# Patient Record
Sex: Female | Born: 1970 | Race: Black or African American | Hispanic: No | Marital: Married | Smoking: Never smoker
Health system: Southern US, Community
[De-identification: ages and names within clinical notes are randomized; demographics above are authoritative.]

## PROBLEM LIST (undated history)

## (undated) DIAGNOSIS — I10 Essential (primary) hypertension: Secondary | ICD-10-CM

## (undated) DIAGNOSIS — I471 Supraventricular tachycardia, unspecified: Secondary | ICD-10-CM

## (undated) DIAGNOSIS — J309 Allergic rhinitis, unspecified: Secondary | ICD-10-CM

## (undated) DIAGNOSIS — Z8632 Personal history of gestational diabetes: Secondary | ICD-10-CM

## (undated) DIAGNOSIS — R61 Generalized hyperhidrosis: Secondary | ICD-10-CM

## (undated) DIAGNOSIS — G43909 Migraine, unspecified, not intractable, without status migrainosus: Secondary | ICD-10-CM

## (undated) DIAGNOSIS — E559 Vitamin D deficiency, unspecified: Secondary | ICD-10-CM

## (undated) HISTORY — DX: Migraine, unspecified, not intractable, without status migrainosus: G43.909

## (undated) HISTORY — PX: WISDOM TOOTH EXTRACTION: SHX21

## (undated) HISTORY — DX: Essential (primary) hypertension: I10

## (undated) HISTORY — DX: Generalized hyperhidrosis: R61

## (undated) HISTORY — DX: Personal history of gestational diabetes: Z86.32

## (undated) HISTORY — DX: Allergic rhinitis, unspecified: J30.9

## (undated) HISTORY — PX: FOOT SURGERY: SHX648

## (undated) HISTORY — DX: Vitamin D deficiency, unspecified: E55.9

---

## 1999-06-04 ENCOUNTER — Inpatient Hospital Stay (HOSPITAL_COMMUNITY): Admission: AD | Admit: 1999-06-04 | Discharge: 1999-06-04 | Payer: Self-pay | Admitting: Obstetrics & Gynecology

## 1999-06-23 ENCOUNTER — Inpatient Hospital Stay (HOSPITAL_COMMUNITY): Admission: AD | Admit: 1999-06-23 | Discharge: 1999-06-27 | Payer: Self-pay | Admitting: Obstetrics and Gynecology

## 1999-07-01 ENCOUNTER — Encounter (HOSPITAL_COMMUNITY): Admission: RE | Admit: 1999-07-01 | Discharge: 1999-09-29 | Payer: Self-pay | Admitting: *Deleted

## 1999-08-06 ENCOUNTER — Other Ambulatory Visit: Admission: RE | Admit: 1999-08-06 | Discharge: 1999-08-06 | Payer: Self-pay | Admitting: *Deleted

## 2000-07-29 ENCOUNTER — Other Ambulatory Visit: Admission: RE | Admit: 2000-07-29 | Discharge: 2000-07-29 | Payer: Self-pay | Admitting: *Deleted

## 2000-08-31 ENCOUNTER — Encounter: Payer: Self-pay | Admitting: Emergency Medicine

## 2000-08-31 ENCOUNTER — Emergency Department (HOSPITAL_COMMUNITY): Admission: EM | Admit: 2000-08-31 | Discharge: 2000-08-31 | Payer: Self-pay | Admitting: Emergency Medicine

## 2001-08-24 ENCOUNTER — Other Ambulatory Visit: Admission: RE | Admit: 2001-08-24 | Discharge: 2001-08-24 | Payer: Self-pay | Admitting: *Deleted

## 2001-10-30 ENCOUNTER — Ambulatory Visit (HOSPITAL_COMMUNITY): Admission: RE | Admit: 2001-10-30 | Discharge: 2001-10-30 | Payer: Self-pay | Admitting: Family Medicine

## 2001-10-30 ENCOUNTER — Encounter: Payer: Self-pay | Admitting: Family Medicine

## 2002-09-12 ENCOUNTER — Other Ambulatory Visit: Admission: RE | Admit: 2002-09-12 | Discharge: 2002-09-12 | Payer: Self-pay | Admitting: *Deleted

## 2003-01-29 ENCOUNTER — Emergency Department (HOSPITAL_COMMUNITY): Admission: EM | Admit: 2003-01-29 | Discharge: 2003-01-29 | Payer: Self-pay | Admitting: Emergency Medicine

## 2003-01-29 ENCOUNTER — Encounter: Payer: Self-pay | Admitting: Emergency Medicine

## 2003-09-15 ENCOUNTER — Other Ambulatory Visit: Admission: RE | Admit: 2003-09-15 | Discharge: 2003-09-15 | Payer: Self-pay | Admitting: *Deleted

## 2004-10-23 ENCOUNTER — Other Ambulatory Visit: Admission: RE | Admit: 2004-10-23 | Discharge: 2004-10-23 | Payer: Self-pay | Admitting: Obstetrics & Gynecology

## 2005-12-22 ENCOUNTER — Other Ambulatory Visit: Admission: RE | Admit: 2005-12-22 | Discharge: 2005-12-22 | Payer: Self-pay | Admitting: Obstetrics and Gynecology

## 2006-07-09 ENCOUNTER — Other Ambulatory Visit: Admission: RE | Admit: 2006-07-09 | Discharge: 2006-07-09 | Payer: Self-pay | Admitting: Obstetrics & Gynecology

## 2007-04-12 ENCOUNTER — Other Ambulatory Visit: Admission: RE | Admit: 2007-04-12 | Discharge: 2007-04-12 | Payer: Self-pay | Admitting: Obstetrics and Gynecology

## 2007-10-01 ENCOUNTER — Inpatient Hospital Stay (HOSPITAL_COMMUNITY): Admission: AD | Admit: 2007-10-01 | Discharge: 2007-10-01 | Payer: Self-pay | Admitting: Obstetrics and Gynecology

## 2007-11-09 ENCOUNTER — Encounter: Admission: RE | Admit: 2007-11-09 | Discharge: 2007-12-02 | Payer: Self-pay | Admitting: Obstetrics and Gynecology

## 2007-12-13 ENCOUNTER — Inpatient Hospital Stay (HOSPITAL_COMMUNITY): Admission: AD | Admit: 2007-12-13 | Discharge: 2007-12-15 | Payer: Self-pay | Admitting: Obstetrics and Gynecology

## 2007-12-16 ENCOUNTER — Encounter: Admission: RE | Admit: 2007-12-16 | Discharge: 2008-01-08 | Payer: Self-pay | Admitting: Obstetrics and Gynecology

## 2007-12-19 ENCOUNTER — Inpatient Hospital Stay (HOSPITAL_COMMUNITY): Admission: AD | Admit: 2007-12-19 | Discharge: 2007-12-19 | Payer: Self-pay | Admitting: Obstetrics and Gynecology

## 2008-01-13 ENCOUNTER — Encounter: Admission: RE | Admit: 2008-01-13 | Discharge: 2008-01-20 | Payer: Self-pay | Admitting: Obstetrics and Gynecology

## 2009-01-16 ENCOUNTER — Emergency Department (HOSPITAL_BASED_OUTPATIENT_CLINIC_OR_DEPARTMENT_OTHER): Admission: EM | Admit: 2009-01-16 | Discharge: 2009-01-17 | Payer: Self-pay | Admitting: Emergency Medicine

## 2009-01-16 ENCOUNTER — Ambulatory Visit: Payer: Self-pay | Admitting: Diagnostic Radiology

## 2010-11-09 ENCOUNTER — Emergency Department (HOSPITAL_BASED_OUTPATIENT_CLINIC_OR_DEPARTMENT_OTHER)
Admission: EM | Admit: 2010-11-09 | Discharge: 2010-11-09 | Payer: Self-pay | Source: Home / Self Care | Admitting: Emergency Medicine

## 2010-11-18 ENCOUNTER — Encounter
Admission: RE | Admit: 2010-11-18 | Discharge: 2010-11-18 | Payer: Self-pay | Source: Home / Self Care | Attending: Family Medicine | Admitting: Family Medicine

## 2011-03-25 NOTE — H&P (Signed)
NAMEMACKAYLA, Arnold                 ACCOUNT NO.:  [id number]   MEDICAL RECORD NO.:  [id number]          PATIENT TYPE:  INP   LOCATION:  9164                          FACILITY:  [facility]   PHYSICIAN:  Hal Morales, M.D.DATE OF BIRTH:  10-Mar-1971   DATE OF ADMISSION:  12/13/2007  DATE OF DISCHARGE:                              HISTORY & PHYSICAL   HISTORY OF PRESENT ILLNESS:  [name] is a 40 year old black female  gravida 3, para 5-0-1-1 at 35-6/7 weeks who presents with leaking fluids  since 3 o'clock a.m. with spontaneous contractions.  Since that time,  she denies bleeding, headache or visual disturbances.  Her pregnancy has  been followed by the [facility] MD service and has been  remarkable for:  1. Previous C-section for failure to progress in 2000.  2. Placenta previa that resolved at 29 weeks.  3. History of macrosomic infant.  4. Migraines.  5. Anemia.  6. Advanced maternal age.  7. Gestational diabetes with recent start on insulin.  8. Group B strep unknown.   PRENATAL LABORATORIES:  Her prenatal labs were collected on June 08, 2007 hemoglobin 12.7, hematocrit 38.4, platelets 229,000.  Blood type O+  and antibody negative, sickle cell trait negative.  RPR nonreactive,  rubella immune, hepatitis B surface antigen negative, HIV nonreactive,  gonorrhea negative, chlamydia negative.  Ferritin 30.  A one-hour  Glucola from October 20, 2007, was elevated.  A 3-hour glucose  tolerance test in December 2008 was abnormal.   HISTORY OF PRESENT PREGNANCY:  The patient presented for care at [facility] on June 08, 2007, at [redacted] weeks gestation.  Ultrasound was done at  that first visit for viability due to spotting.  EDC was confirmed by  that ultrasound as being January 11, 2008.  Placenta previa was noted.  Due  to her ferritin level of 30, she was instructed to try iron two to three  per day.  Anatomy ultrasound at [redacted] weeks gestation shows growth  consistent with  previous dating.  Placenta is still present.  At this  point, the patient was desiring a VBAC for delivery mode.  Ultrasound at  [redacted] weeks gestation.  She has continued previa.  The patient was treated  for yeast infection at that visit.  At 26-1/2 weeks, she was due for her  Glucola test which was postponed due to glycosuria due to sweet tea.  She was reporting heart palpitations at that visit at 26 weeks.  She was  referred to Cardiology for evaluation.  Ultrasound at 29 weeks shows  previa resolved.  Her one-hour Glucola was elevated, and she completed  her 3-hour glucose tolerance test in December, and that was abnormal.  At 31 weeks, she was [facility]  instructions.  The patient's gestational diabetes was managed by diet  until approximately 2 weeks ago when she was started on some insulin.  The patient also started antenatal testing in the last couple weeks,  which has been normal.  Her next ultrasound was scheduled for February  5.  She also had a repeat C section planned for February 26.  Rest of  her prenatal care has been unremarkable.   OBSTETRIC HISTORY:  She is gravida 3, para 1-0-1-1.  In March 1994 at [redacted]  weeks gestation, she had an elective AB with no complications.  In  August 2000, she had a C-section for a viable female weighing 9 pounds 6  ounces at [redacted] weeks gestation after 24 hours in labor.  She had an  epidural for anesthesia.  Incision type was verified as low transverse  as well as a two layer closure.  Infant's name was Arlys John, and there were  no complications with that pregnancy.  This third pregnancy is the  current pregnancy.   PAST MEDICAL HISTORY:  1. No medication allergies.  2. Menarche at the age of 26 with monthly cycles lasting 5-7 days.  3. Used oral contraception and condoms in the past as well as film.  4. Fibroids noted in her breasts and occasional ovarian cyst.  5. Reports never having chicken pox.  6.  Occasional yeast infection.  7. History of anemia.  8. Migraines for which she takes Tylenol.   SURGICAL HISTORY:  1. Wisdom teeth extraction.  2. Foot surgery.  3. Cesarean section in 2000.   FAMILY MEDICAL HISTORY:  Maternal grandmother with CHF.  Mother with  heart murmur.  Maternal grandfather with chronic hypertension.  Multiple  family members with anemia.  Maternal grandfather with CVA.   GENETIC HISTORY:  Remarkable for patient at age 69 at time of delivery.  Mother with congenital heart disease   SOCIAL HISTORY:  The patient has undergone a name change during the  pregnancy.  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  She denies any alcohol, tobacco or illicit drug use with the  pregnancy.   OBJECTIVE:  VITAL SIGNS:  Stable.  CBG is 105.  She is afebrile.  HEENT:  Grossly within normal limits.  CHEST:  Clear to auscultation.  HEART:  Regular rate and rhythm.  ABDOMEN:  Gravid in contour with fundal height extending approximately  36 cm above pubic symphysis.  Fetal heart rate is reassuring.  Contractions every 2-3 minutes, cervix is 2 cm, 80% effaced, vertex -2  with clear fluid present.  EXTREMITIES:  Normal.   ASSESSMENT:  1. Intrauterine pregnancy at 35-6/7 weeks.  2. Previous C-section with a attempt for VBAC.  3. Unknown group B strep.   PLAN:  1. Admit to birthing suites, Dr. Pennie Rushing has been notified.  2. Routine MD orders.  3. Begin penicillin G for unknown group B strep.  4. The patient aware of options for repeat C-section versus VBAC.      Risks and benefits have been reviewed.  The patient verbalizes      intent for trial of labor at present and signed the VBAC consent      form.      Cam Hai, C.N.M.      Hal Morales, M.D.  Electronically Signed    KS/MEDQ  D:  12/13/2007  T:  12/13/2007  Job:  [id number]

## 2011-08-01 LAB — CBC
HCT: 26.8 — ABNORMAL LOW
HCT: 32.4 — ABNORMAL LOW
Hemoglobin: 10.3 — ABNORMAL LOW
Hemoglobin: 10.9 — ABNORMAL LOW
Hemoglobin: 9.1 — ABNORMAL LOW
MCHC: 33.7
MCHC: 33.8
MCHC: 34.2
MCV: 90.8
MCV: 91.5
Platelets: 282
RBC: 3.57 — ABNORMAL LOW
RDW: 14.1
RDW: 14.8
WBC: 8.8

## 2011-08-01 LAB — URINE MICROSCOPIC-ADD ON

## 2011-08-01 LAB — URINALYSIS, ROUTINE W REFLEX MICROSCOPIC
Nitrite: NEGATIVE
Specific Gravity, Urine: <1.005 — ABNORMAL LOW
Urobilinogen, UA: 0.2

## 2011-08-01 LAB — COMPREHENSIVE METABOLIC PANEL
ALT: 77 — ABNORMAL HIGH
Albumin: 2.5 — ABNORMAL LOW
Calcium: 8.2 — ABNORMAL LOW
Glucose, Bld: 80
Sodium: 141
Total Protein: 5.4 — ABNORMAL LOW

## 2011-08-01 LAB — STREP B DNA PROBE: Strep Group B Ag: NEGATIVE

## 2011-08-01 LAB — LACTATE DEHYDROGENASE: LDH: 239

## 2011-08-01 LAB — URIC ACID: Uric Acid, Serum: 4.9

## 2011-08-19 LAB — URINALYSIS, ROUTINE W REFLEX MICROSCOPIC
Bilirubin Urine: NEGATIVE
Nitrite: NEGATIVE
Specific Gravity, Urine: 1.02
Urobilinogen, UA: 0.2
pH: 5.5

## 2011-08-19 LAB — CBC
Hemoglobin: 10.5 — ABNORMAL LOW
MCHC: 33.2
RBC: 3.39 — ABNORMAL LOW
WBC: 10.3

## 2011-12-28 ENCOUNTER — Encounter (HOSPITAL_BASED_OUTPATIENT_CLINIC_OR_DEPARTMENT_OTHER): Payer: Self-pay | Admitting: *Deleted

## 2011-12-28 ENCOUNTER — Emergency Department (HOSPITAL_BASED_OUTPATIENT_CLINIC_OR_DEPARTMENT_OTHER)
Admission: EM | Admit: 2011-12-28 | Discharge: 2011-12-28 | Disposition: A | Discharge status: Home / Self Care | Payer: Self-pay | Attending: Emergency Medicine | Admitting: Emergency Medicine

## 2011-12-28 DIAGNOSIS — G43909 Migraine, unspecified, not intractable, without status migrainosus: Secondary | ICD-10-CM | POA: Insufficient documentation

## 2011-12-28 DIAGNOSIS — R112 Nausea with vomiting, unspecified: Secondary | ICD-10-CM | POA: Insufficient documentation

## 2011-12-28 MED ORDER — SODIUM CHLORIDE 0.9 % IV BOLUS (SEPSIS)
1000.0000 mL | Freq: Once | INTRAVENOUS | Status: AC
  Administered 2011-12-28: 1000 mL via INTRAVENOUS

## 2011-12-28 MED ORDER — METOCLOPRAMIDE HCL 5 MG/ML IJ SOLN
10.0000 mg | Freq: Once | INTRAMUSCULAR | Status: AC
  Administered 2011-12-28: 10 mg via INTRAVENOUS
  Filled 2011-12-28: qty 2

## 2011-12-28 MED ORDER — DIPHENHYDRAMINE HCL 50 MG/ML IJ SOLN
25.0000 mg | Freq: Once | INTRAMUSCULAR | Status: AC
  Administered 2011-12-28: 25 mg via INTRAVENOUS
  Filled 2011-12-28: qty 1

## 2011-12-28 MED ORDER — DEXAMETHASONE SODIUM PHOSPHATE 10 MG/ML IJ SOLN
10.0000 mg | Freq: Once | INTRAMUSCULAR | Status: AC
  Administered 2011-12-28: 10 mg via INTRAVENOUS
  Filled 2011-12-28: qty 1

## 2011-12-28 NOTE — ED Notes (Signed)
Pt presents to ED with HA/migraine.  Pt has recent dx of sinus infection .  Pt has hx of same.  Pt took no otc ibuprofen PTA.

## 2011-12-28 NOTE — ED Notes (Signed)
Pt presnts to ED today with HA since Sat.  Pt has associated nausea.  Pt last took "migraine" medicine last night with no relief.  Pt also took Mucinex DM last night.

## 2011-12-28 NOTE — ED Provider Notes (Addendum)
History     CSN: [id number]  Arrival date & time 12/28/11  0441   First MD Initiated Contact with Patient 12/28/11 0501      Chief Complaint  Patient presents with  . Headache    (Consider location/radiation/quality/duration/timing/severity/associated sxs/prior treatment) Patient is a 41 y.o. female presenting with headaches. The history is provided by the patient.  Headache  This is a recurrent problem. The current episode started yesterday. The problem occurs constantly. The problem has been gradually worsening. The headache is associated with bright light and loud noise. The pain is located in the occipital region. The quality of the pain is described as throbbing. The pain is at a severity of 8/10. The pain is severe. The pain does not radiate. Associated symptoms include nausea and vomiting. Pertinent negatives include no fever, no near-syncope and no palpitations. She has tried resting in a darkened room and triptan therapy for the symptoms. The treatment provided no relief.    History reviewed. No pertinent past medical history.  History reviewed. No pertinent past surgical history.  History reviewed. No pertinent family history.  History  Substance Use Topics  . Smoking status: Not on file  . Smokeless tobacco: Not on file  . Alcohol Use: Not on file    OB History    Grav Para Term Preterm Abortions TAB SAB Ect Mult Living                  Review of Systems  Constitutional: Negative for fever.  Cardiovascular: Negative for palpitations and near-syncope.  Gastrointestinal: Positive for nausea and vomiting.  Neurological: Positive for headaches.  All other systems reviewed and are negative.    Allergies  Review of patient's allergies indicates no known allergies.  Home Medications  No current outpatient prescriptions on file.  BP 143/95  Pulse 93  Temp(Src) 97.8 F (36.6 C) (Oral)  Resp 20  SpO2 100%  Physical Exam  Nursing note and vitals  reviewed. Constitutional: She is oriented to person, place, and time. She appears well-developed and well-nourished. No distress.  HENT:  Head: Normocephalic and atraumatic.  Mouth/Throat: Mucous membranes are dry.  Eyes: EOM are normal. Pupils are equal, round, and reactive to light.  Fundoscopic exam:      The right eye shows no papilledema.       The left eye shows no papilledema.  Neck: Normal range of motion. Neck supple.  Cardiovascular: Normal rate, regular rhythm, normal heart sounds and intact distal pulses.  Exam reveals no friction rub.   No murmur heard. Pulmonary/Chest: Effort normal and breath sounds normal. She has no wheezes. She has no rales.  Abdominal: Soft. Bowel sounds are normal. She exhibits no distension. There is no tenderness. There is no rebound and no guarding.  Musculoskeletal: Normal range of motion. She exhibits no tenderness.       No edema  Lymphadenopathy:    She has no cervical adenopathy.  Neurological: She is alert and oriented to person, place, and time. She has normal strength. No cranial nerve deficit or sensory deficit. Gait normal.       photophobia  Skin: Skin is warm and dry. No rash noted.  Psychiatric: She has a normal mood and affect. Her behavior is normal.    ED Course  Procedures (including critical care time)  Labs Reviewed - No data to display No results found.   1. Migraine       MDM   Pt with typical migraine HA without sx  suggestive of SAH(sudden onset, worst of life, or deficits), infection, or cavernous vein thrombosis.  Normal neuro exam and vital signs. Will give HA cocktail and on re-eval.    5:54 AM Pt feeling much better and ready to go home.    Gwyneth Sprout, MD 12/28/11 8119  Gwyneth Sprout, MD 12/28/11 [phone]

## 2012-10-11 ENCOUNTER — Encounter: Payer: Self-pay | Admitting: Obstetrics and Gynecology

## 2012-10-11 ENCOUNTER — Ambulatory Visit (INDEPENDENT_AMBULATORY_CARE_PROVIDER_SITE_OTHER): Payer: Self-pay | Admitting: Obstetrics and Gynecology

## 2012-10-11 VITALS — BP 110/76 | Ht 65.0 in | Wt 159.0 lb

## 2012-10-11 DIAGNOSIS — Z01419 Encounter for gynecological examination (general) (routine) without abnormal findings: Secondary | ICD-10-CM

## 2012-10-11 MED ORDER — VALACYCLOVIR HCL 500 MG PO TABS: 500.0000 mg | ORAL_TABLET | Freq: Every day | ORAL | Status: AC

## 2012-10-11 MED ORDER — AMBULATORY NON FORMULARY MEDICATION: Status: DC

## 2012-10-11 NOTE — Progress Notes (Signed)
ANNUAL GYNECOLOGIC EXAMINATION   Krystal Arnold is a 41 y.o. female, G3P2002, who presents for an annual exam. She complains that her breasts are very large and heavy in this causes her to have shoulder and back pain.  She has had a Mirena IUD since March of 2009.  She complains of recurrent vaginal holder.  Port-A-Cath as suppositories several well in the past.  She has a history of herpes but has infrequent outbreaks. She has headaches on a monthly basis.    History   Social History  . Marital Status: Married    Spouse Name: N/A    Number of Children: N/A  . Years of Education: N/A   Social History Main Topics  . Smoking status: Never Smoker   . Smokeless tobacco: Never Used  . Alcohol Use: Yes  . Drug Use: No  . Sexually Active: Yes    Birth Control/ Protection: IUD     Comment: Mirena    Other Topics Concern  . None   Social History Narrative  . None    Menstrual cycle:   LMP: No LMP recorded. Patient is not currently having periods (Reason: IUD).             The following portions of the patient's history were reviewed and updated as appropriate: allergies, current medications, past family history, past medical history, past social history, past surgical history and problem list.  Review of Systems Pertinent items are noted in HPI. Breast:Negative for breast lump,nipple discharge or nipple retraction Gastrointestinal: Negative for abdominal pain, change in bowel habits or rectal bleeding Urinary:negative   Objective:    BP 110/76  Ht 5\' 5"  (1.651 m)  Wt 159 lb (72.122 kg)  BMI 26.46 kg/m2    Weight:  Wt Readings from Last 1 Encounters:  10/11/12 159 lb (72.122 kg)          BMI: Body mass index is 26.46 kg/(m^2).  General Appearance: Alert, appropriate appearance for age. No acute distress HEENT: Grossly normal Neck / Thyroid: Supple, no masses, nodes or enlargement Lungs: clear to auscultation bilaterally Back: No CVA tenderness Breast Exam: No masses or  nodes.No dimpling, nipple retraction or discharge. Cardiovascular: Regular rate and rhythm. S1, S2, no murmur Gastrointestinal: Soft, non-tender, no masses or organomegaly  ++++++++++++++++++++++++++++++++++++++++++++++++++++++++  Pelvic Exam: External genitalia: normal general appearance Vaginal: normal without tenderness, induration or masses. Relaxation: No Cervix: normal appearance, IUD string present Adnexa: normal bimanual exam Uterus: normal size, shape, and consistency Rectovaginal: normal rectal, no masses  ++++++++++++++++++++++++++++++++++++++++++++++++++++++++  Lymphatic Exam: Non-palpable nodes in neck, clavicular, axillary, or inguinal regions Neurologic: Normal speech, no tremor  Psychiatric: Alert and oriented, appropriate affect.  Assessment:    Normal gyn exam   Overweight or obese: Yes   Pelvic relaxation: No  Herpes virus  Large and heavy breast causing back and shoulder pain.  Headaches  Recurrent vaginal odor   Plan:    Mammogram  Return to office in March 2014 to remove the IUD and to discuss contraception again.  Contraception:IUD    Medications prescribed: Valtrex 500 mg daily, boric acid suppositories as needed  STD screen request: No   The updated Pap smear screening guidelines were discussed with the patient. The patient requested that I obtain a Pap smear: No.    Kegel exercises discussed: No.  Proper diet and regular exercise were reviewed.  Annual mammograms recommended starting at age 65. Proper breast care was discussed.  Screening colonoscopy is recommended beginning at age 74.  Regular  health maintenance was reviewed.  Sleep hygiene was discussed.  Leonard Schwartz M.D.  Regular Periods: no IUD  Mammogram: yes  Monthly Breast Ex.: no Exercise: yes  Tetanus < 10 years: yes Seatbelts: yes  NI. Bladder Functn.: yes Abuse at home: no  Daily BM's: no Stressful Work: yes  Healthy Diet: yes Sigmoid-Colonoscopy:  never had one   Calcium: yes Medical problems this year: migraines    LAST PAP:2011  Contraception: IUD Mirena  Mammogram: pt unsure ?   PCP: Dr.White  PMH: unchanged  FMH: unchanged    Next pap due 2014

## 2013-05-12 ENCOUNTER — Ambulatory Visit (INDEPENDENT_AMBULATORY_CARE_PROVIDER_SITE_OTHER): Payer: BC Managed Care – PPO | Admitting: Neurology

## 2013-05-12 ENCOUNTER — Encounter: Payer: Self-pay | Admitting: Neurology

## 2013-05-12 ENCOUNTER — Encounter: Payer: Self-pay | Admitting: Nurse Practitioner

## 2013-05-12 VITALS — BP 131/91 | HR 96 | Ht 65.75 in | Wt 146.0 lb

## 2013-05-12 DIAGNOSIS — G43909 Migraine, unspecified, not intractable, without status migrainosus: Secondary | ICD-10-CM | POA: Insufficient documentation

## 2013-05-12 MED ORDER — RIZATRIPTAN BENZOATE 10 MG PO TBDP: 10.0000 mg | ORAL_TABLET | ORAL | Status: DC | PRN

## 2013-05-12 MED ORDER — DICLOFENAC POTASSIUM(MIGRAINE) 50 MG PO PACK: 50.0000 mg | PACK | ORAL | Status: DC | PRN

## 2013-05-12 NOTE — Patient Instructions (Signed)
Magnesium oxide 400mg, riboflavin 100mg twice a day 

## 2013-05-12 NOTE — Progress Notes (Signed)
[facility]  PATIENT: Krystal Arnold DOB: 12-08-1970  HISTORICAL Krystal Arnold is a 42 years old right-handed African American female, referred by her primary care physician Dr. Aram Beecham white for evaluation of migraine headaches  She has a long-standing history of migraine, usually happen around her menstruation period of time, lateralized retro-orbital area severe pounding headache was associated light noise sensitivity, she has it once to twice a month, lasting up to few days, she is taking Maxalt as needed, which has been very helpful, she has never tried preventive medications in the past.  In April 28th 2014, she had her IUD MIRENA changed, she then began to have more frequent migraines, almost daily basis, it has improved over the past 2 weeks, sometimes she takes [facility] powder, ibuprofen with Maxalt, which enhanced the benefit of Maxalt,  REVIEW OF SYSTEMS: Full 14 system review of systems performed and notable only for blurred vision, memory loss, confusion, headache,  ALLERGIES: No Known Allergies  HOME MEDICATIONS: Outpatient Prescriptions Prior to Visit  Medication Sig Dispense Refill  . AMBULATORY NON FORMULARY MEDICATION Medication Name: BORIC ACID  Insert 1 cap into the vagina twice each week  24 capsule  12  . cetirizine (ZYRTEC) 10 MG tablet Take 10 mg by mouth daily.      . Ibuprofen 200 MG CAPS Take 600 mg by mouth as needed (600-800 mg as needed for tooth pain).       . Multiple Vitamin (MULTIVITAMIN) capsule Take 1 capsule by mouth daily.       No facility-administered medications prior to visit.    PAST MEDICAL HISTORY: Past Medical History  Diagnosis Date  . Hyperhydrosis disorder   . Allergic rhinitis   . Migraine   . Hx of gestational diabetes mellitus, not currently pregnant   . Hypertension     after pregnancy  . Vitamin D deficiency     PAST SURGICAL HISTORY: Past Surgical History  Procedure Laterality Date  . Cesarean section    .  Foot surgery    . Wisdom tooth extraction      FAMILY HISTORY: Family History  Problem Relation Age of Onset  . Heart murmur Mother   . Heart disease Maternal Grandmother   . Hypertension Maternal Grandfather     SOCIAL HISTORY:  History   Social History  . Marital Status: Married    Spouse Name: Krystal Arnold     Number of Children: N/A  . Years of Education: N/A   Occupational History  .     Social History Main Topics  . Smoking status: Never Smoker   . Smokeless tobacco: Never Used  . Alcohol Use: .6 - 1.2 oz/week    1-2 Shots of liquor per week     Comment: weekly  . Drug Use: No  . Sexually Active: Yes    Birth Control/ Protection: IUD     Comment: Mirena    Other Topics Concern  . Not on file   Social History Narrative  . No narrative on file     PHYSICAL EXAM    Filed Vitals:   05/12/13 1031  BP: 131/91  Pulse: 96  Height: 5' 5.75" (1.67 m)  Weight: 146 lb (66.225 kg)    Not recorded    Body mass index is 23.75 kg/(m^2).   Generalized: In no acute distress  Neck: Supple, no carotid bruits   Cardiac: Regular rate rhythm  Pulmonary: Clear to auscultation bilaterally  Musculoskeletal: No deformity  Neurological examination  Mentation: Alert oriented to time, place, history taking, and causual conversation  Cranial nerve II-XII: Pupils were equal round reactive to light extraocular movements were full, visual field were full on confrontational test. facial sensation and strength were normal. hearing was intact to finger rubbing bilaterally. Uvula tongue midline.  head turning and shoulder shrug and were normal and symmetric.Tongue protrusion into cheek strength was normal.  Motor: normal tone, bulk and strength.  Sensory: Intact to fine touch, pinprick, preserved vibratory sensation, and proprioception at toes.  Coordination: Normal finger to nose, heel-to-shin bilaterally there was no truncal ataxia  Gait: Rising up from seated  position without assistance, normal stance, without trunk ataxia, moderate stride, good arm swing, smooth turning, able to perform tiptoe, and heel walking without difficulty.   Romberg signs: Negative  Deep tendon reflexes: Brachioradialis 2/2, biceps 2/2, triceps 2/2, patellar 2/2, Achilles 2/2, plantar responses were flexor bilaterally.   DIAGNOSTIC DATA (LABS, IMAGING, TESTING) - I reviewed patient records, labs, notes, testing and imaging myself where available.  Lab Results  Component Value Date   WBC 6.7 12/19/2007   HGB 10.3* 12/19/2007   HCT 30.5* 12/19/2007   MCV 91.7 12/19/2007   PLT 282 DELTA CHECK NOTED 12/19/2007      Component Value Date/Time   NA 141 12/19/2007 1152   K 3.9 12/19/2007 1152   CL 109 12/19/2007 1152   CO2 26 12/19/2007 1152   GLUCOSE 80 12/19/2007 1152   BUN 9 12/19/2007 1152   CREATININE 0.81 12/19/2007 1152   CALCIUM 8.2* 12/19/2007 1152   PROT 5.4* 12/19/2007 1152   ALBUMIN 2.5* 12/19/2007 1152   AST 51* 12/19/2007 1152   ALT 77* 12/19/2007 1152   ALKPHOS 86 12/19/2007 1152   BILITOT 0.7 12/19/2007 1152   GFRNONAA >60 12/19/2007 1152   GFRAA  Value: >60        The eGFR has been calculated using the MDRD equation. This calculation has not been validated in all clinical 12/19/2007 1152   ASSESSMENT AND PLAN  42 years old right-handed [place] American female, with history of migraine headaches, now presenting was frequent headaches, since her hormonal IUD was changed, normal neurological examination  1. magnesium oxide, riboflavin twice a day as preventive medications. 2. [place]ia, Maxalt prn for abortive treatment. 3. RTC in 4 months  Levert Feinstein, M.D. Ph.D.  [facility] [address], [address] [place], [place] [address] [phone]

## 2013-09-12 ENCOUNTER — Ambulatory Visit: Payer: BC Managed Care – PPO | Admitting: Neurology

## 2014-09-11 ENCOUNTER — Encounter: Payer: Self-pay | Admitting: Neurology

## 2014-10-18 ENCOUNTER — Encounter (HOSPITAL_COMMUNITY): Payer: Self-pay | Admitting: Emergency Medicine

## 2014-10-18 ENCOUNTER — Emergency Department (HOSPITAL_COMMUNITY)
Admission: EM | Admit: 2014-10-18 | Discharge: 2014-10-18 | Disposition: A | Discharge status: Home / Self Care | Payer: Medicaid Other | Source: Home / Self Care | Attending: Emergency Medicine | Admitting: Emergency Medicine

## 2014-10-18 DIAGNOSIS — H5789 Other specified disorders of eye and adnexa: Secondary | ICD-10-CM

## 2014-10-18 DIAGNOSIS — H578 Other specified disorders of eye and adnexa: Secondary | ICD-10-CM

## 2014-10-18 MED ORDER — KETOTIFEN FUMARATE 0.025 % OP SOLN: 1.0000 [drp] | Freq: Two times a day (BID) | OPHTHALMIC | Status: DC

## 2014-10-18 NOTE — ED Notes (Signed)
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  She has been having Left eye irritation  For past 5 days.  Also has some pain to left side of throat and left ear.

## 2014-10-18 NOTE — ED Provider Notes (Signed)
CSN: [id number]     Arrival date & time 10/18/14  1159 History   First MD Initiated Contact with Patient 10/18/14 1220     Chief Complaint  Patient presents with  . Conjunctivitis   (Consider location/radiation/quality/duration/timing/severity/associated sxs/prior Treatment) HPI Comments: Reports intermittently itchy left eye with associated scratchy throat and occasional mild ear pressure. States symptoms have been mild and present over last 4-5 days. No contact lens use. No changes in vision. PCP: Dr. Esmond Harps. [employer] in [employer].   Patient is a 43 y.o. female presenting with conjunctivitis. The history is provided by the patient.  Conjunctivitis    Past Medical History  Diagnosis Date  . Hyperhydrosis disorder   . Allergic rhinitis   . Migraine   . Hx of gestational diabetes mellitus, not currently pregnant   . Hypertension     after pregnancy  . Vitamin D deficiency    Past Surgical History  Procedure Laterality Date  . Cesarean section    . Foot surgery    . Wisdom tooth extraction     Family History  Problem Relation Age of Onset  . Heart murmur Mother   . Heart disease Maternal Grandmother   . Hypertension Maternal Grandfather    History  Substance Use Topics  . Smoking status: Never Smoker   . Smokeless tobacco: Never Used  . Alcohol Use: 0.6 - 1.2 oz/week    1-2 Shots of liquor per week     Comment: weekly   OB History    Gravida Para Term Preterm AB TAB SAB Ectopic Multiple Living   3 2 2       2      Review of Systems  All other systems reviewed and are negative.   Allergies  Review of patient's allergies indicates no known allergies.  Home Medications   Prior to Admission medications   Medication Sig Start Date End Date Taking? Authorizing Provider  AMBULATORY NON FORMULARY MEDICATION Medication Name: BORIC ACID  Insert 1 cap into the vagina twice each week 10/11/12   Kirkland HunArthur Stringer, MD  aspirin-acetaminophen-caffeine [facility](EXCEDRIN MIGRAINE)  [phone] MG per tablet Take 2 tablets by mouth every 6 (six) hours as needed for pain.    Historical Provider, MD  cetirizine (ZYRTEC) 10 MG tablet Take 10 mg by mouth daily.    Historical Provider, MD  Cholecalciferol (VITAMIN D PO) Take by mouth.    Historical Provider, MD  Diclofenac Potassium (CAMBIA) 50 MG PACK Take 50 mg by mouth as needed. 05/12/13   Levert FeinsteinYijun Yan, MD  Ibuprofen 200 MG CAPS Take 600 mg by mouth as needed (600-800 mg as needed for tooth pain).     Historical Provider, MD  ketotifen (ZADITOR) 0.025 % ophthalmic solution Place 1 drop into the left eye 2 (two) times daily. 10/18/14   Ria ClockJennifer Lee H Wagner Tanzi, PA  levonorgestrel (MIRENA) 20 MCG/24HR IUD 1 each by Intrauterine route as directed.    Historical Provider, MD  Multiple Vitamin (MULTIVITAMIN) capsule Take 1 capsule by mouth daily.    Historical Provider, MD  OMEGA 3 1000 MG CAPS Take 1 capsule by mouth daily.    Historical Provider, MD  promethazine (PHENERGAN) 25 MG suppository Place 25 mg rectally every 6 (six) hours as needed for nausea.    Historical Provider, MD  rizatriptan (MAXALT-MLT) 10 MG disintegrating tablet  03/08/13   Historical Provider, MD   BP 131/96 mmHg  Pulse 82  Temp(Src) 98.6 F (37 C) (Oral)  Resp 14  SpO2 98%  LMP 10/15/2014 Physical Exam  Constitutional: She is oriented to person, place, and time. She appears well-developed and well-nourished. No distress.  HENT:  Head: Normocephalic and atraumatic.  Right Ear: Hearing, tympanic membrane, external ear and ear canal normal.  Left Ear: Hearing, tympanic membrane, external ear and ear canal normal.  Nose: Nose normal.  Mouth/Throat: Uvula is midline, oropharynx is clear and moist and mucous membranes are normal.  Eyes: Conjunctivae, EOM and lids are normal. Pupils are equal, round, and reactive to light. Right eye exhibits no discharge, no exudate and no hordeolum. Left eye exhibits no chemosis, no discharge, no exudate and no hordeolum. No  foreign body present in the left eye. No scleral icterus.  Slit lamp exam:      The left eye shows no corneal abrasion, no corneal ulcer and no fluorescein uptake.  Cardiovascular: Normal rate, regular rhythm and normal heart sounds.   Pulmonary/Chest: Effort normal and breath sounds normal.  Musculoskeletal: Normal range of motion.  Neurological: She is alert and oriented to person, place, and time.  Skin: Skin is warm and dry.  Psychiatric: She has a normal mood and affect. Her behavior is normal.  Nursing note and vitals reviewed.   ED Course  Procedures (including critical care time) Labs Review Labs Reviewed - No data to display  Imaging Review No results found.   MDM   1. Eye irritation   Visual acuity normal. Exam without abnormal finding. Reassured patient and suggested using Zaditor as prescribed with follow up if no improvement. May return to work.    Ria ClockJennifer Lee H Maccoy Haubner, [place] 10/18/14 [phone]

## 2014-10-18 NOTE — Discharge Instructions (Signed)
The good news is that your exam is normal and without any suggestion of infection. I would suggest that you try using the medication you have been prescribed for left eye irritation. If symptoms do not improve or worsen, please feel free to follow up with our facility or your primary care doctor.

## 2014-10-31 ENCOUNTER — Encounter (HOSPITAL_COMMUNITY): Payer: Self-pay | Admitting: Emergency Medicine

## 2014-10-31 ENCOUNTER — Emergency Department (HOSPITAL_COMMUNITY)
Admission: EM | Admit: 2014-10-31 | Discharge: 2014-10-31 | Disposition: A | Discharge status: Home / Self Care | Payer: Medicaid Other | Source: Home / Self Care | Attending: Family Medicine | Admitting: Family Medicine

## 2014-10-31 DIAGNOSIS — R7309 Other abnormal glucose: Secondary | ICD-10-CM

## 2014-10-31 DIAGNOSIS — R7303 Prediabetes: Secondary | ICD-10-CM

## 2014-10-31 LAB — POCT I-STAT, CHEM 8
BUN: 16 mg/dL (ref 6–23)
CALCIUM ION: 1.2 mmol/L (ref 1.12–1.23)
CREATININE: 0.9 mg/dL (ref 0.50–1.10)
Chloride: 106 mEq/L (ref 96–112)
Glucose, Bld: 122 mg/dL — ABNORMAL HIGH (ref 70–99)
HCT: 38 % (ref 36.0–46.0)
Hemoglobin: 12.9 g/dL (ref 12.0–15.0)
POTASSIUM: 3.7 mmol/L (ref 3.5–5.1)
Sodium: 141 mmol/L (ref 135–145)
TCO2: 25 mmol/L (ref 0–100)

## 2014-10-31 LAB — POCT URINALYSIS DIP (DEVICE)
BILIRUBIN URINE: NEGATIVE
Glucose, UA: NEGATIVE mg/dL
HGB URINE DIPSTICK: NEGATIVE
KETONES UR: NEGATIVE mg/dL
LEUKOCYTES UA: NEGATIVE
Nitrite: NEGATIVE
PH: 6.5 (ref 5.0–8.0)
PROTEIN: NEGATIVE mg/dL
SPECIFIC GRAVITY, URINE: 1.025 (ref 1.005–1.030)
Urobilinogen, UA: 1 mg/dL (ref 0.0–1.0)

## 2014-10-31 LAB — POCT PREGNANCY, URINE: Preg Test, Ur: NEGATIVE

## 2014-10-31 NOTE — ED Provider Notes (Signed)
CSN: [id number]     Arrival date & time 10/31/14  1553 History   First MD Initiated Contact with Patient 10/31/14 1608     Chief Complaint  Patient presents with  . Dizziness  . Headache  . Polydipsia   (Consider location/radiation/quality/duration/timing/severity/associated sxs/prior Treatment) Patient is a 43 y.o. female presenting with general illness. The history is provided by the patient.  Illness Severity:  Mild Onset quality:  Gradual Duration:  2 months Chronicity:  New Context:  Pt with mult vague complaints which seen by lmd and had blood work but no results given, now with tingling fingertips and pressure in ears and palpitations. Associated symptoms: ear pain   Associated symptoms: no abdominal pain, no fever, no nausea and no vomiting     Past Medical History  Diagnosis Date  . Hyperhydrosis disorder   . Allergic rhinitis   . Migraine   . Hx of gestational diabetes mellitus, not currently pregnant   . Hypertension     after pregnancy  . Vitamin D deficiency    Past Surgical History  Procedure Laterality Date  . Cesarean section    . Foot surgery    . Wisdom tooth extraction     Family History  Problem Relation Age of Onset  . Heart murmur Mother   . Heart disease Maternal Grandmother   . Hypertension Maternal Grandfather    History  Substance Use Topics  . Smoking status: Never Smoker   . Smokeless tobacco: Never Used  . Alcohol Use: 0.6 - 1.2 oz/week    1-2 Shots of liquor per week     Comment: weekly   OB History    Gravida Para Term Preterm AB TAB SAB Ectopic Multiple Living   3 2 2       2      Review of Systems  Constitutional: Negative.  Negative for fever.  HENT: Positive for ear pain.   Respiratory: Negative.   Cardiovascular: Positive for palpitations.  Gastrointestinal: Negative.  Negative for nausea, vomiting and abdominal pain.    Allergies  Review of patient's allergies indicates no known allergies.  Home Medications    Prior to Admission medications   Medication Sig Start Date End Date Taking? Authorizing Provider  AMBULATORY NON FORMULARY MEDICATION Medication Name: BORIC ACID  Insert 1 cap into the vagina twice each week 10/11/12   Kirkland HunArthur Stringer, MD  aspirin-acetaminophen-caffeine [facility](EXCEDRIN MIGRAINE) [phone] MG per tablet Take 2 tablets by mouth every 6 (six) hours as needed for pain.    Historical Provider, MD  cetirizine (ZYRTEC) 10 MG tablet Take 10 mg by mouth daily.    Historical Provider, MD  Cholecalciferol (VITAMIN D PO) Take by mouth.    Historical Provider, MD  Diclofenac Potassium (CAMBIA) 50 MG PACK Take 50 mg by mouth as needed. 05/12/13   Levert FeinsteinYijun Yan, MD  Ibuprofen 200 MG CAPS Take 600 mg by mouth as needed (600-800 mg as needed for tooth pain).     Historical Provider, MD  ketotifen (ZADITOR) 0.025 % ophthalmic solution Place 1 drop into the left eye 2 (two) times daily. 10/18/14   Ria ClockJennifer Lee H Presson, PA  levonorgestrel (MIRENA) 20 MCG/24HR IUD 1 each by Intrauterine route as directed.    Historical Provider, MD  Multiple Vitamin (MULTIVITAMIN) capsule Take 1 capsule by mouth daily.    Historical Provider, MD  OMEGA 3 1000 MG CAPS Take 1 capsule by mouth daily.    Historical Provider, MD  promethazine (PHENERGAN) 25 MG suppository Place  25 mg rectally every 6 (six) hours as needed for nausea.    Historical Provider, MD  rizatriptan (MAXALT-MLT) 10 MG disintegrating tablet  03/08/13   Historical Provider, MD   BP 136/96 mmHg  Pulse 97  Temp(Src) 98 F (36.7 C)  Resp 16  SpO2 99%  LMP 10/15/2014 Physical Exam  Constitutional: She is oriented to person, place, and time. She appears well-developed and well-nourished.  HENT:  Mouth/Throat: Oropharynx is clear and moist.  Neck: Normal range of motion. Neck supple.  Cardiovascular: Normal rate, regular rhythm, normal heart sounds and intact distal pulses.   Pulmonary/Chest: Effort normal and breath sounds normal.  Abdominal: Soft.  Bowel sounds are normal.  Neurological: She is alert and oriented to person, place, and time.  Skin: Skin is warm and dry.  Nursing note and vitals reviewed.   ED Course  Procedures (including critical care time) Labs Review Labs Reviewed  POCT I-STAT, CHEM 8 - Abnormal; Notable for the following:    Glucose, Bld 122 (*)    All other components within normal limits  POCT URINALYSIS DIP (DEVICE)  POCT PREGNANCY, URINE   i-stat bs 122 otherwise labs wnl. Imaging Review No results found.   MDM   1. Glucose intolerance (pre-diabetes)        Linna HoffJames D Kindl, MD 10/31/14 [phone]

## 2014-10-31 NOTE — Discharge Instructions (Signed)
Your sugar was 122, otherwise tests were nl. See your doctor to discuss further testing.

## 2014-10-31 NOTE — ED Notes (Signed)
Pt states that she has had excessive thirst with dizziness and headache since 10/26/2014.

## 2014-11-17 ENCOUNTER — Other Ambulatory Visit (HOSPITAL_COMMUNITY)
Admission: RE | Admit: 2014-11-17 | Discharge: 2014-11-17 | Disposition: A | Discharge status: Home / Self Care | Payer: Medicaid Other | Source: Ambulatory Visit | Attending: Obstetrics and Gynecology | Admitting: Obstetrics and Gynecology

## 2014-11-17 ENCOUNTER — Other Ambulatory Visit: Payer: Self-pay

## 2014-11-17 ENCOUNTER — Other Ambulatory Visit: Payer: Self-pay | Admitting: Obstetrics and Gynecology

## 2014-11-17 DIAGNOSIS — Z01411 Encounter for gynecological examination (general) (routine) with abnormal findings: Secondary | ICD-10-CM | POA: Diagnosis not present

## 2014-11-17 DIAGNOSIS — Z1231 Encounter for screening mammogram for malignant neoplasm of breast: Secondary | ICD-10-CM

## 2014-11-17 DIAGNOSIS — Z1151 Encounter for screening for human papillomavirus (HPV): Secondary | ICD-10-CM | POA: Insufficient documentation

## 2014-11-20 LAB — CYTOLOGY - PAP

## 2014-11-22 ENCOUNTER — Ambulatory Visit
Admission: RE | Admit: 2014-11-22 | Discharge: 2014-11-22 | Disposition: A | Discharge status: Home / Self Care | Payer: Medicaid Other | Source: Ambulatory Visit

## 2014-11-22 DIAGNOSIS — Z1231 Encounter for screening mammogram for malignant neoplasm of breast: Secondary | ICD-10-CM

## 2014-12-28 ENCOUNTER — Other Ambulatory Visit: Payer: Self-pay | Admitting: Family Medicine

## 2014-12-28 ENCOUNTER — Ambulatory Visit
Admission: RE | Admit: 2014-12-28 | Discharge: 2014-12-28 | Disposition: A | Discharge status: Home / Self Care | Payer: Medicaid Other | Source: Ambulatory Visit | Attending: Family Medicine | Admitting: Family Medicine

## 2014-12-28 DIAGNOSIS — S6000XD Contusion of unspecified finger without damage to nail, subsequent encounter: Secondary | ICD-10-CM

## 2015-01-21 ENCOUNTER — Emergency Department (HOSPITAL_BASED_OUTPATIENT_CLINIC_OR_DEPARTMENT_OTHER)
Admission: EM | Admit: 2015-01-21 | Discharge: 2015-01-21 | Disposition: A | Discharge status: Home / Self Care | Payer: Medicaid Other | Attending: Emergency Medicine | Admitting: Emergency Medicine

## 2015-01-21 ENCOUNTER — Encounter (HOSPITAL_BASED_OUTPATIENT_CLINIC_OR_DEPARTMENT_OTHER): Payer: Self-pay

## 2015-01-21 DIAGNOSIS — Z791 Long term (current) use of non-steroidal anti-inflammatories (NSAID): Secondary | ICD-10-CM | POA: Insufficient documentation

## 2015-01-21 DIAGNOSIS — R51 Headache: Secondary | ICD-10-CM | POA: Diagnosis present

## 2015-01-21 DIAGNOSIS — G43809 Other migraine, not intractable, without status migrainosus: Secondary | ICD-10-CM | POA: Diagnosis not present

## 2015-01-21 DIAGNOSIS — Z872 Personal history of diseases of the skin and subcutaneous tissue: Secondary | ICD-10-CM | POA: Insufficient documentation

## 2015-01-21 DIAGNOSIS — Z8709 Personal history of other diseases of the respiratory system: Secondary | ICD-10-CM | POA: Diagnosis not present

## 2015-01-21 DIAGNOSIS — E559 Vitamin D deficiency, unspecified: Secondary | ICD-10-CM | POA: Insufficient documentation

## 2015-01-21 DIAGNOSIS — J0100 Acute maxillary sinusitis, unspecified: Secondary | ICD-10-CM | POA: Diagnosis not present

## 2015-01-21 DIAGNOSIS — I10 Essential (primary) hypertension: Secondary | ICD-10-CM | POA: Insufficient documentation

## 2015-01-21 DIAGNOSIS — Z79899 Other long term (current) drug therapy: Secondary | ICD-10-CM | POA: Insufficient documentation

## 2015-01-21 DIAGNOSIS — Z8632 Personal history of gestational diabetes: Secondary | ICD-10-CM | POA: Diagnosis not present

## 2015-01-21 DIAGNOSIS — Z792 Long term (current) use of antibiotics: Secondary | ICD-10-CM | POA: Diagnosis not present

## 2015-01-21 MED ORDER — METOCLOPRAMIDE HCL 5 MG/ML IJ SOLN
10.0000 mg | Freq: Once | INTRAMUSCULAR | Status: AC
  Administered 2015-01-21: 10 mg via INTRAVENOUS
  Filled 2015-01-21: qty 2

## 2015-01-21 MED ORDER — SODIUM CHLORIDE 0.9 % IV BOLUS (SEPSIS)
1000.0000 mL | Freq: Once | INTRAVENOUS | Status: AC
  Administered 2015-01-21: 1000 mL via INTRAVENOUS

## 2015-01-21 MED ORDER — FLUTICASONE PROPIONATE 50 MCG/ACT NA SUSP: NASAL | Status: DC

## 2015-01-21 MED ORDER — HYDROCODONE-ACETAMINOPHEN 5-325 MG PO TABS: 1.0000 | ORAL_TABLET | ORAL | Status: DC | PRN

## 2015-01-21 MED ORDER — DIPHENHYDRAMINE HCL 50 MG/ML IJ SOLN
25.0000 mg | Freq: Once | INTRAMUSCULAR | Status: AC
  Administered 2015-01-21: 25 mg via INTRAVENOUS
  Filled 2015-01-21: qty 1

## 2015-01-21 MED ORDER — KETOROLAC TROMETHAMINE 30 MG/ML IJ SOLN
30.0000 mg | Freq: Once | INTRAMUSCULAR | Status: AC
  Administered 2015-01-21: 30 mg via INTRAVENOUS
  Filled 2015-01-21: qty 1

## 2015-01-21 NOTE — ED Provider Notes (Signed)
CSN: [id number]     Arrival date & time 01/21/15  [id number] History   First MD Initiated Contact with Patient 01/21/15 [phone]     Chief Complaint  Patient presents with  . Headache     HPI  Patient is unsure evaluation of a headache. Seen by her physician on Thursday with facial and right upper tooth pain. Diagnosed with sinusitis. Placed on Augmentin. Has similar right facial pain but has developed throbbing right periorbital and occipital headache consistent with her migraine syndrome. Taken Maxalt at home without relief. Nausea and symptoms persist, she presents here.  Past Medical History  Diagnosis Date  . Hyperhydrosis disorder   . Allergic rhinitis   . Migraine   . Hx of gestational diabetes mellitus, not currently pregnant   . Hypertension     after pregnancy  . Vitamin D deficiency    Past Surgical History  Procedure Laterality Date  . Cesarean section    . Foot surgery    . Wisdom tooth extraction     Family History  Problem Relation Age of Onset  . Heart murmur Mother   . Heart disease Maternal Grandmother   . Hypertension Maternal Grandfather    History  Substance Use Topics  . Smoking status: Never Smoker   . Smokeless tobacco: Never Used  . Alcohol Use: 0.6 - 1.2 oz/week    1-2 Shots of liquor per week     Comment: weekly   OB History    Gravida Para Term Preterm AB TAB SAB Ectopic Multiple Living   3 2 2       2      Review of Systems  Constitutional: Negative for fever, chills, diaphoresis, appetite change and fatigue.  HENT: Positive for congestion and sinus pressure. Negative for mouth sores, sore throat and trouble swallowing.   Eyes: Negative for visual disturbance.  Respiratory: Negative for cough, chest tightness, shortness of breath and wheezing.   Cardiovascular: Negative for chest pain.  Gastrointestinal: Positive for nausea. Negative for vomiting, abdominal pain, diarrhea and abdominal distention.  Endocrine: Negative for polydipsia, polyphagia  and polyuria.  Genitourinary: Negative for dysuria, frequency and hematuria.  Musculoskeletal: Negative for gait problem.  Skin: Negative for color change, pallor and rash.  Neurological: Positive for headaches. Negative for dizziness, syncope and light-headedness.  Hematological: Does not bruise/bleed easily.  Psychiatric/Behavioral: Negative for behavioral problems and confusion.      Allergies  Review of patient's allergies indicates no known allergies.  Home Medications   Prior to Admission medications   Medication Sig Start Date End Date Taking? Authorizing Provider  amoxicillin-clavulanate (AUGMENTIN) 875-125 MG per tablet Take 1 tablet by mouth 2 (two) times daily.   Yes Historical Provider, MD  AMBULATORY NON FORMULARY MEDICATION Medication Name: BORIC ACID  Insert 1 cap into the vagina twice each week 10/11/12   Kirkland HunArthur Stringer, MD  aspirin-acetaminophen-caffeine [facility](EXCEDRIN MIGRAINE) [phone] MG per tablet Take 2 tablets by mouth every 6 (six) hours as needed for pain.    Historical Provider, MD  cetirizine (ZYRTEC) 10 MG tablet Take 10 mg by mouth daily.    Historical Provider, MD  Cholecalciferol (VITAMIN D PO) Take by mouth.    Historical Provider, MD  Diclofenac Potassium (CAMBIA) 50 MG PACK Take 50 mg by mouth as needed. 05/12/13   Levert FeinsteinYijun Yan, MD  fluticasone Aleda Grana(FLONASE) 50 MCG/ACT nasal spray 1 spray each nares twice a day 01/21/15   Rolland PorterMark Acsa Estey, MD  HYDROcodone-acetaminophen (NORCO/VICODIN) 5-325 MG per tablet Take 1 tablet  by mouth every 4 (four) hours as needed. 01/21/15   Rolland Porter, MD  Ibuprofen 200 MG CAPS Take 600 mg by mouth as needed (600-800 mg as needed for tooth pain).     Historical Provider, MD  ketotifen (ZADITOR) 0.025 % ophthalmic solution Place 1 drop into the left eye 2 (two) times daily. 10/18/14   Ria Clock, PA  levonorgestrel (MIRENA) 20 MCG/24HR IUD 1 each by Intrauterine route as directed.    Historical Provider, MD  Multiple Vitamin  (MULTIVITAMIN) capsule Take 1 capsule by mouth daily.    Historical Provider, MD  OMEGA 3 1000 MG CAPS Take 1 capsule by mouth daily.    Historical Provider, MD  promethazine (PHENERGAN) 25 MG suppository Place 25 mg rectally every 6 (six) hours as needed for nausea.    Historical Provider, MD  rizatriptan (MAXALT-MLT) 10 MG disintegrating tablet  03/08/13   Historical Provider, MD   BP 126/96 mmHg  Pulse 81  Temp(Src) 98.2 F (36.8 C) (Oral)  Resp 18  Ht  (1.651 m)  Wt 150 lb (68.04 kg)  BMI 24.96 kg/m2  SpO2 96% Physical Exam  Constitutional: She is oriented to person, place, and time. She appears well-developed and well-nourished. No distress.  HENT:  Head: Normocephalic.    Right maxillary tenderness. No rash along the head or neck. Nares and posterior pharynx benign.  Eyes: Conjunctivae are normal. Pupils are equal, round, and reactive to light. No scleral icterus.  Neck: Normal range of motion. Neck supple. No thyromegaly present.  Cardiovascular: Normal rate and regular rhythm.  Exam reveals no gallop and no friction rub.   No murmur heard. Pulmonary/Chest: Effort normal and breath sounds normal. No respiratory distress. She has no wheezes. She has no rales.  Abdominal: Soft. Bowel sounds are normal. She exhibits no distension. There is no tenderness. There is no rebound.  Musculoskeletal: Normal range of motion.  Neurological: She is alert and oriented to person, place, and time.  Skin: Skin is warm and dry. No rash noted.  Psychiatric: She has a normal mood and affect. Her behavior is normal.    ED Course  Procedures (including critical care time) Labs Review Labs Reviewed - No data to display  Imaging Review No results found.   EKG Interpretation None      MDM   Final diagnoses:  Acute maxillary sinusitis, recurrence not specified  Other migraine without status migrainosus, not intractable     Given IV migraine cocktail. States her headache is gone.  Continues to experience some facial pressure but states overall she feels much much better. Plan is Flonase, and Sudafed. Continue her Augmentin, take with food. Limited number of Vicodin for her facial pain from her sinus infection.    Rolland Porter, MD 01/21/15 [phone]

## 2015-01-21 NOTE — ED Notes (Signed)
Patient here with right sided headache since Wednesday also complains of sinus congestion, on augmentin for sinusitis. Denies trauma. Alert and oriented

## 2015-01-21 NOTE — Discharge Instructions (Signed)
Continue your antibiotic, take with food. Flonase nasal spray twice a day. Vicodin for pain Take a decongestant such as Sudafed each day.  Migraine Headache A migraine headache is an intense, throbbing pain on one or both sides of your head. A migraine can last for 30 minutes to several hours. CAUSES  The exact cause of a migraine headache is not always known. However, a migraine may be caused when nerves in the brain become irritated and release chemicals that cause inflammation. This causes pain. Certain things may also trigger migraines, such as:  Alcohol.  Smoking.  Stress.  Menstruation.  Aged cheeses.  Foods or drinks that contain nitrates, glutamate, aspartame, or tyramine.  Lack of sleep.  Chocolate.  Caffeine.  Hunger.  Physical exertion.  Fatigue.  Medicines used to treat chest pain (nitroglycerine), birth control pills, estrogen, and some blood pressure medicines. SIGNS AND SYMPTOMS  Pain on one or both sides of your head.  Pulsating or throbbing pain.  Severe pain that prevents daily activities.  Pain that is aggravated by any physical activity.  Nausea, vomiting, or both.  Dizziness.  Pain with exposure to bright lights, loud noises, or activity.  General sensitivity to bright lights, loud noises, or smells. Before you get a migraine, you may get warning signs that a migraine is coming (aura). An aura may include:  Seeing flashing lights.  Seeing bright spots, halos, or zigzag lines.  Having tunnel vision or blurred vision.  Having feelings of numbness or tingling.  Having trouble talking.  Having muscle weakness. DIAGNOSIS  A migraine headache is often diagnosed based on:  Symptoms.  Physical exam.  A CT scan or MRI of your head. These imaging tests cannot diagnose migraines, but they can help rule out other causes of headaches. TREATMENT Medicines may be given for pain and nausea. Medicines can also be given to help prevent  recurrent migraines.  HOME CARE INSTRUCTIONS  Only take over-the-counter or prescription medicines for pain or discomfort as directed by your health care provider. The use of long-term narcotics is not recommended.  Lie down in a dark, quiet room when you have a migraine.  Keep a journal to find out what may trigger your migraine headaches. For example, write down:  What you eat and drink.  How much sleep you get.  Any change to your diet or medicines.  Limit alcohol consumption.  Quit smoking if you smoke.  Get 7-9 hours of sleep, or as recommended by your health care provider.  Limit stress.  Keep lights dim if bright lights bother you and make your migraines worse. SEEK IMMEDIATE MEDICAL CARE IF:   Your migraine becomes severe.  You have a fever.  You have a stiff neck.  You have vision loss.  You have muscular weakness or loss of muscle control.  You start losing your balance or have trouble walking.  You feel faint or pass out.  You have severe symptoms that are different from your first symptoms. MAKE SURE YOU:   Understand these instructions.  Will watch your condition.  Will get help right away if you are not doing well or get worse. Document Released: 10/27/2005 Document Revised: 03/13/2014 Document Reviewed: 07/04/2013 [facility] Patient Information 2015 [place], [place]. This information is not intended to replace advice given to you by your health care provider. Make sure you discuss any questions you have with your health care provider.  Sinusitis Sinusitis is redness, soreness, and inflammation of the paranasal sinuses. Paranasal sinuses are air  pockets within the bones of your face (beneath the eyes, the middle of the forehead, or above the eyes). In healthy paranasal sinuses, mucus is able to drain out, and air is able to circulate through them by way of your nose. However, when your paranasal sinuses are inflamed, mucus and air can become trapped.  This can allow bacteria and other germs to grow and cause infection. Sinusitis can develop quickly and last only a short time (acute) or continue over a long period (chronic). Sinusitis that lasts for more than 12 weeks is considered chronic.  CAUSES  Causes of sinusitis include:  Allergies.  Structural abnormalities, such as displacement of the cartilage that separates your nostrils (deviated septum), which can decrease the air flow through your nose and sinuses and affect sinus drainage.  Functional abnormalities, such as when the small hairs (cilia) that line your sinuses and help remove mucus do not work properly or are not present. SIGNS AND SYMPTOMS  Symptoms of acute and chronic sinusitis are the same. The primary symptoms are pain and pressure around the affected sinuses. Other symptoms include:  Upper toothache.  Earache.  Headache.  Bad breath.  Decreased sense of smell and taste.  A cough, which worsens when you are lying flat.  Fatigue.  Fever.  Thick drainage from your nose, which often is green and may contain pus (purulent).  Swelling and warmth over the affected sinuses. DIAGNOSIS  Your health care provider will perform a physical exam. During the exam, your health care provider may:  Look in your nose for signs of abnormal growths in your nostrils (nasal polyps).  Tap over the affected sinus to check for signs of infection.  View the inside of your sinuses (endoscopy) using an imaging device that has a light attached (endoscope). If your health care provider suspects that you have chronic sinusitis, one or more of the following tests may be recommended:  Allergy tests.  Nasal culture. A sample of mucus is taken from your nose, sent to a lab, and screened for bacteria.  Nasal cytology. A sample of mucus is taken from your nose and examined by your health care provider to determine if your sinusitis is related to an allergy. TREATMENT  Most cases of  acute sinusitis are related to a viral infection and will resolve on their own within 10 days. Sometimes medicines are prescribed to help relieve symptoms (pain medicine, decongestants, nasal steroid sprays, or saline sprays).  However, for sinusitis related to a bacterial infection, your health care provider will prescribe antibiotic medicines. These are medicines that will help kill the bacteria causing the infection.  Rarely, sinusitis is caused by a fungal infection. In theses cases, your health care provider will prescribe antifungal medicine. For some cases of chronic sinusitis, surgery is needed. Generally, these are cases in which sinusitis recurs more than 3 times per year, despite other treatments. HOME CARE INSTRUCTIONS   Drink plenty of water. Water helps thin the mucus so your sinuses can drain more easily.  Use a humidifier.  Inhale steam 3 to 4 times a day (for example, sit in the bathroom with the shower running).  Apply a warm, moist washcloth to your face 3 to 4 times a day, or as directed by your health care provider.  Use saline nasal sprays to help moisten and clean your sinuses.  Take medicines only as directed by your health care provider.  If you were prescribed either an antibiotic or antifungal medicine, finish it all even  if you start to feel better. SEEK IMMEDIATE MEDICAL CARE IF:  You have increasing pain or severe headaches.  You have nausea, vomiting, or drowsiness.  You have swelling around your face.  You have vision problems.  You have a stiff neck.  You have difficulty breathing. MAKE SURE YOU:   Understand these instructions.  Will watch your condition.  Will get help right away if you are not doing well or get worse. Document Released: 10/27/2005 Document Revised: 03/13/2014 Document Reviewed: 11/11/2011 [facility] Patient Information 2015 [place]. This information is not intended to replace advice given to you by your health care  provider. Make sure you discuss any questions you have with your health care provider.

## 2015-11-16 ENCOUNTER — Other Ambulatory Visit (HOSPITAL_COMMUNITY)
Admission: RE | Admit: 2015-11-16 | Discharge: 2015-11-16 | Disposition: A | Discharge status: Home / Self Care | Payer: Medicaid Other | Source: Ambulatory Visit | Attending: Obstetrics and Gynecology | Admitting: Obstetrics and Gynecology

## 2015-11-16 ENCOUNTER — Other Ambulatory Visit: Payer: Self-pay | Admitting: Obstetrics and Gynecology

## 2015-11-16 DIAGNOSIS — Z01419 Encounter for gynecological examination (general) (routine) without abnormal findings: Secondary | ICD-10-CM | POA: Insufficient documentation

## 2015-11-21 LAB — CYTOLOGY - PAP

## 2015-11-27 ENCOUNTER — Other Ambulatory Visit: Payer: Self-pay

## 2015-11-27 DIAGNOSIS — Z1231 Encounter for screening mammogram for malignant neoplasm of breast: Secondary | ICD-10-CM

## 2015-12-14 ENCOUNTER — Ambulatory Visit
Admission: RE | Admit: 2015-12-14 | Discharge: 2015-12-14 | Disposition: A | Discharge status: Home / Self Care | Payer: Medicaid Other | Source: Ambulatory Visit

## 2015-12-14 DIAGNOSIS — Z1231 Encounter for screening mammogram for malignant neoplasm of breast: Secondary | ICD-10-CM

## 2017-06-01 DIAGNOSIS — H5203 Hypermetropia, bilateral: Secondary | ICD-10-CM | POA: Diagnosis not present

## 2017-06-11 DIAGNOSIS — H40023 Open angle with borderline findings, high risk, bilateral: Secondary | ICD-10-CM | POA: Diagnosis not present

## 2017-06-29 DIAGNOSIS — K1379 Other lesions of oral mucosa: Secondary | ICD-10-CM | POA: Diagnosis not present

## 2017-07-22 DIAGNOSIS — L309 Dermatitis, unspecified: Secondary | ICD-10-CM | POA: Diagnosis not present

## 2017-07-30 DIAGNOSIS — I10 Essential (primary) hypertension: Secondary | ICD-10-CM | POA: Diagnosis not present

## 2017-07-30 DIAGNOSIS — G43909 Migraine, unspecified, not intractable, without status migrainosus: Secondary | ICD-10-CM | POA: Diagnosis not present

## 2017-07-30 DIAGNOSIS — R002 Palpitations: Secondary | ICD-10-CM | POA: Diagnosis not present

## 2017-07-30 DIAGNOSIS — R7303 Prediabetes: Secondary | ICD-10-CM | POA: Diagnosis not present

## 2017-07-30 DIAGNOSIS — Z23 Encounter for immunization: Secondary | ICD-10-CM | POA: Diagnosis not present

## 2017-08-04 DIAGNOSIS — R002 Palpitations: Secondary | ICD-10-CM | POA: Diagnosis not present

## 2017-08-05 DIAGNOSIS — R002 Palpitations: Secondary | ICD-10-CM | POA: Diagnosis not present

## 2017-08-06 DIAGNOSIS — R002 Palpitations: Secondary | ICD-10-CM | POA: Diagnosis not present

## 2017-09-10 ENCOUNTER — Emergency Department (HOSPITAL_BASED_OUTPATIENT_CLINIC_OR_DEPARTMENT_OTHER)
Admission: EM | Admit: 2017-09-10 | Discharge: 2017-09-10 | Disposition: A | Discharge status: Home / Self Care | Payer: BLUE CROSS/BLUE SHIELD | Attending: Emergency Medicine | Admitting: Emergency Medicine

## 2017-09-10 ENCOUNTER — Encounter (HOSPITAL_BASED_OUTPATIENT_CLINIC_OR_DEPARTMENT_OTHER): Payer: Self-pay | Admitting: *Deleted

## 2017-09-10 DIAGNOSIS — Z79899 Other long term (current) drug therapy: Secondary | ICD-10-CM | POA: Insufficient documentation

## 2017-09-10 DIAGNOSIS — K0889 Other specified disorders of teeth and supporting structures: Secondary | ICD-10-CM | POA: Diagnosis not present

## 2017-09-10 DIAGNOSIS — I10 Essential (primary) hypertension: Secondary | ICD-10-CM | POA: Insufficient documentation

## 2017-09-10 MED ORDER — TRAMADOL HCL 50 MG PO TABS: 50.0000 mg | ORAL_TABLET | Freq: Four times a day (QID) | ORAL | 0 refills | Status: DC | PRN

## 2017-09-10 MED ORDER — PENICILLIN V POTASSIUM 500 MG PO TABS: 500.0000 mg | ORAL_TABLET | Freq: Four times a day (QID) | ORAL | 0 refills | Status: AC

## 2017-09-10 NOTE — ED Triage Notes (Signed)
Dental pain. She cracked a tooth. Pain has gotten worse over the past 2 weeks.

## 2017-09-10 NOTE — ED Provider Notes (Signed)
[facility] EMERGENCY DEPARTMENT Provider Note   CSN: [id number] Arrival date & time: 09/10/17  1817     History   Chief Complaint Chief Complaint  Patient presents with  . Dental Pain    HPI Krystal Arnold is a 46 y.o. female.  The history is provided by the patient and medical records. No language interpreter was used.  Dental Pain      Krystal Arnold is a 46 y.o. female who presents to the Emergency Department complaining of persistent, gradually worsening, right-sided, lower dental pain. Initially started several weeks ago, but pain acutely worsened over the last week. Initially was an intermittent dull ache. Pt describes their pain as throbbing with a burning pain that radiates up to jaw when she bites down on that toot. Pt has been taking ibuprofen at home which initially was helping, but not in the last two day.They are currently followed by dentistry. She called today to arrange an appointment, but her dentist was out of town. She spoke with an on-call dentist who told her she could call her in some antibiotics or she could go to the ER. Pt denies facial swelling, fever, chills, difficulty breathing, difficulty swallowing.    Past Medical History:  Diagnosis Date  . Allergic rhinitis   . Hx of gestational diabetes mellitus, not currently pregnant   . Hyperhydrosis disorder   . Hypertension    after pregnancy  . Migraine   . Vitamin D deficiency     Patient Active Problem List   Diagnosis Date Noted  . Migraine 05/12/2013    Past Surgical History:  Procedure Laterality Date  . CESAREAN SECTION    . FOOT SURGERY    . WISDOM TOOTH EXTRACTION      OB History    Gravida Para Term Preterm AB Living   3 2 2     2    SAB TAB Ectopic Multiple Live Births           2       Home Medications    Prior to Admission medications   Medication Sig Start Date End Date Taking? Authorizing Provider  AMLODIPINE BESYLATE PO Take by mouth.   Yes [provider]  cetirizine (ZYRTEC) 10 MG tablet Take 10 mg by mouth daily.   Yes [provider]  fluticasone Aleda Grana) 50 MCG/ACT nasal spray 1 spray each nares twice a day 01/21/15  Yes Rolland Porter, MD  levonorgestrel [facility]) 20 MCG/24HR IUD 1 each by Intrauterine route as directed.   Yes [provider]  Linaclotide (LINZESS PO) Take by mouth.   Yes [provider]  Multiple Vitamin (MULTIVITAMIN) capsule Take 1 capsule by mouth daily.   Yes [provider]  promethazine (PHENERGAN) 25 MG suppository Place 25 mg rectally every 6 (six) hours as needed for nausea.   Yes [provider]  propranolol (INDERAL) 20 MG tablet Take 20 mg by mouth 3 (three) times daily.   Yes [provider]  rizatriptan (MAXALT-MLT) 10 MG disintegrating tablet  03/08/13  Yes [provider]  AMBULATORY NON FORMULARY MEDICATION Medication Name: BORIC ACID  Insert 1 cap into the vagina twice each week 10/11/12   Kirkland Hun, MD  amoxicillin-clavulanate (AUGMENTIN) 875-125 MG per tablet Take 1 tablet by mouth 2 (two) times daily.    [provider]  aspirin-acetaminophen-caffeine (EXCEDRIN MIGRAINE) 8637311874 MG per tablet Take 2 tablets by mouth every 6 (six) hours as needed for pain.    [provider]  Cholecalciferol (VITAMIN D PO) Take by mouth.    [provider]  Diclofenac Potassium (CAMBIA) 50 MG PACK Take 50 mg by mouth as needed. 05/12/13   Levert Feinstein, MD  HYDROcodone-acetaminophen (NORCO/VICODIN) 5-325 MG per tablet Take 1 tablet by mouth every 4 (four) hours as needed. 01/21/15   Rolland Porter, MD  Ibuprofen 200 MG CAPS Take 600 mg by mouth as needed (600-800 mg as needed for tooth pain).     [provider]  ketotifen (ZADITOR) 0.025 % ophthalmic solution Place 1 drop into the left eye 2 (two) times daily. 10/18/14   Presson, Jess Barters H, PA  OMEGA 3 1000 MG CAPS Take 1 capsule by mouth daily.    [provider]  penicillin v potassium (VEETID) 500 MG tablet Take 1 tablet (500 mg total) by mouth 4 (four) times daily. 09/10/17 09/17/17  Elham Fini, Chase Picket, PA-C  traMADol (ULTRAM) 50 MG tablet Take 1 tablet (50 mg total) by mouth every 6 (six) hours as needed. 09/10/17   Berma Harts, Chase Picket, PA-C    Family History Family History  Problem Relation Age of Onset  . Heart murmur Mother   . Heart disease Maternal Grandmother   . Hypertension Maternal Grandfather     Social History Social History  Substance Use Topics  . Smoking status: Never Smoker  . Smokeless tobacco: Never Used  . Alcohol use 0.6 - 1.2 oz/week    1 - 2 Shots of liquor per week     Comment: weekly     Allergies   Patient has no known allergies.   Review of Systems Review of Systems  Constitutional: Negative for chills and fever.  HENT: Positive for dental problem. Negative for sore throat and trouble swallowing.   Respiratory: Negative for shortness of breath.      Physical Exam Updated Vital Signs BP (!) 157/111 (BP Location: Left Arm)   Pulse 85   Temp 98.6 F (37 C) (Oral)   Resp 20   Ht 5\' 5"  (1.651 m)   Wt 68 kg (150 lb)   SpO2 100%   BMI 24.96 kg/m   Physical Exam  Constitutional: She appears well-developed and well-nourished. No distress.  HENT:  Head: Normocephalic and atraumatic.  Mouth/Throat:    No abscess noted. Midline uvula. No trismus. OP moist and clear. No oropharyngeal erythema or edema. Neck supple with no tenderness. No facial edema.  Neck: Neck supple.  Cardiovascular: Normal rate, regular rhythm and normal heart sounds.   No murmur heard. Pulmonary/Chest: Effort normal and breath sounds normal. No respiratory distress. She has no wheezes. She has no rales.  Musculoskeletal: Normal range of motion.  Neurological: She is alert.  Skin: Skin is warm and dry.  Nursing note and vitals reviewed.    ED Treatments / Results  Labs (all labs ordered are listed, but only  abnormal results are displayed) Labs Reviewed - No data to display  EKG  EKG Interpretation None       Radiology No results found.  Procedures Procedures (including critical care time)  Medications Ordered in ED Medications - No data to display   Initial Impression / Assessment and Plan / ED Course  I have reviewed the triage vital signs and the nursing notes.  Pertinent labs & imaging results that were available during my care of the patient were reviewed by me and considered in my medical decision making (see chart for details).    Krystal Arnold is a  46 y.o. female who presents to ED for dental pain. No abscess requiring immediate incision and drainage. Patient is afebrile, non toxic appearing, and swallowing secretions well. Exam not concerning for Ludwig's angina or pharyngeal abscess. Will treat with PenVK. She agrees to call her dentist in the morning to arrange follow up and I stressed the importance of dental follow up for ultimate management of dental pain. Patient voices understanding and is agreeable to plan.   Final Clinical Impressions(s) / ED Diagnoses   Final diagnoses:  Pain, dental    New Prescriptions New Prescriptions   PENICILLIN V POTASSIUM (VEETID) 500 MG TABLET    Take 1 tablet (500 mg total) by mouth 4 (four) times daily.   TRAMADOL (ULTRAM) 50 MG TABLET    Take 1 tablet (50 mg total) by mouth every 6 (six) hours as needed.     Meshilem Machuca, Chase PicketJaime Pilcher, PA-C 09/10/17 Bennie [id number]    Gwyneth SproutPlunkett, Whitney, MD 09/10/17 [phone]

## 2017-09-10 NOTE — Discharge Instructions (Signed)
You have a dental injury. It is very important that you get evaluated by a dentist as soon as possible. Call tomorrow to schedule an appointment. Alternate between Tylenol and Ibuprofen as needed for mild to moderate pain. Tramadol only as needed for severe pain. Take your full course of antibiotics. Read the instructions below.  Eat a soft or liquid diet and rinse your mouth out after meals with warm water. You should see a dentist or return here at once if you have increased swelling, increased pain or uncontrolled bleeding from the site of your injury.  SEEK MEDICAL CARE IF:  You have increased pain not controlled with medicines.  You have swelling around your tooth, in your face or neck.  You have bleeding which starts, continues, or gets worse.  You have a fever >101 If you are unable to open your mouth

## 2017-09-28 DIAGNOSIS — I10 Essential (primary) hypertension: Secondary | ICD-10-CM | POA: Diagnosis not present

## 2017-09-28 DIAGNOSIS — I471 Supraventricular tachycardia: Secondary | ICD-10-CM | POA: Diagnosis not present

## 2017-12-08 ENCOUNTER — Other Ambulatory Visit: Payer: Self-pay | Admitting: Obstetrics and Gynecology

## 2017-12-08 ENCOUNTER — Other Ambulatory Visit (HOSPITAL_COMMUNITY)
Admission: RE | Admit: 2017-12-08 | Discharge: 2017-12-08 | Disposition: A | Discharge status: Home / Self Care | Payer: BLUE CROSS/BLUE SHIELD | Source: Ambulatory Visit | Attending: Obstetrics and Gynecology | Admitting: Obstetrics and Gynecology

## 2017-12-08 DIAGNOSIS — Z124 Encounter for screening for malignant neoplasm of cervix: Secondary | ICD-10-CM | POA: Insufficient documentation

## 2017-12-08 DIAGNOSIS — N644 Mastodynia: Secondary | ICD-10-CM

## 2017-12-08 DIAGNOSIS — Z01419 Encounter for gynecological examination (general) (routine) without abnormal findings: Secondary | ICD-10-CM | POA: Diagnosis not present

## 2017-12-09 LAB — CYTOLOGY - PAP
Diagnosis: NEGATIVE
HPV: NOT DETECTED

## 2017-12-14 ENCOUNTER — Ambulatory Visit
Admission: RE | Admit: 2017-12-14 | Discharge: 2017-12-14 | Disposition: A | Discharge status: Home / Self Care | Payer: BLUE CROSS/BLUE SHIELD | Source: Ambulatory Visit | Attending: Obstetrics and Gynecology | Admitting: Obstetrics and Gynecology

## 2017-12-14 DIAGNOSIS — N644 Mastodynia: Secondary | ICD-10-CM

## 2017-12-14 DIAGNOSIS — R922 Inconclusive mammogram: Secondary | ICD-10-CM | POA: Diagnosis not present

## 2017-12-15 DIAGNOSIS — M79642 Pain in left hand: Secondary | ICD-10-CM | POA: Diagnosis not present

## 2017-12-28 DIAGNOSIS — H40023 Open angle with borderline findings, high risk, bilateral: Secondary | ICD-10-CM | POA: Diagnosis not present

## 2018-02-18 DIAGNOSIS — G43909 Migraine, unspecified, not intractable, without status migrainosus: Secondary | ICD-10-CM | POA: Diagnosis not present

## 2018-02-18 DIAGNOSIS — Z6830 Body mass index (BMI) 30.0-30.9, adult: Secondary | ICD-10-CM | POA: Diagnosis not present

## 2018-02-18 DIAGNOSIS — I10 Essential (primary) hypertension: Secondary | ICD-10-CM | POA: Diagnosis not present

## 2018-02-18 DIAGNOSIS — R7303 Prediabetes: Secondary | ICD-10-CM | POA: Diagnosis not present

## 2018-05-12 DIAGNOSIS — H5203 Hypermetropia, bilateral: Secondary | ICD-10-CM | POA: Diagnosis not present

## 2018-07-09 ENCOUNTER — Emergency Department (HOSPITAL_BASED_OUTPATIENT_CLINIC_OR_DEPARTMENT_OTHER)
Admission: EM | Admit: 2018-07-09 | Discharge: 2018-07-09 | Disposition: A | Discharge status: Home / Self Care | Payer: BLUE CROSS/BLUE SHIELD | Attending: Emergency Medicine | Admitting: Emergency Medicine

## 2018-07-09 ENCOUNTER — Encounter (HOSPITAL_BASED_OUTPATIENT_CLINIC_OR_DEPARTMENT_OTHER): Payer: Self-pay | Admitting: Emergency Medicine

## 2018-07-09 ENCOUNTER — Other Ambulatory Visit: Payer: Self-pay

## 2018-07-09 DIAGNOSIS — R002 Palpitations: Secondary | ICD-10-CM

## 2018-07-09 DIAGNOSIS — Z79899 Other long term (current) drug therapy: Secondary | ICD-10-CM | POA: Diagnosis not present

## 2018-07-09 DIAGNOSIS — I10 Essential (primary) hypertension: Secondary | ICD-10-CM | POA: Insufficient documentation

## 2018-07-09 HISTORY — DX: Supraventricular tachycardia, unspecified: I47.10

## 2018-07-09 HISTORY — DX: Supraventricular tachycardia: I47.1

## 2018-07-09 LAB — CBC WITH DIFFERENTIAL/PLATELET
Basophils Absolute: 0 10*3/uL (ref 0.0–0.1)
Basophils Relative: 1 %
Eosinophils Absolute: 0.3 10*3/uL (ref 0.0–0.7)
Eosinophils Relative: 5 %
HCT: 40.3 % (ref 36.0–46.0)
Hemoglobin: 13.8 g/dL (ref 12.0–15.0)
LYMPHS ABS: 2.1 10*3/uL (ref 0.7–4.0)
Lymphocytes Relative: 29 %
MCH: 29.9 pg (ref 26.0–34.0)
MCHC: 34.2 g/dL (ref 30.0–36.0)
MCV: 87.2 fL (ref 78.0–100.0)
MONO ABS: 0.8 10*3/uL (ref 0.1–1.0)
Monocytes Relative: 11 %
Neutro Abs: 4 10*3/uL (ref 1.7–7.7)
Neutrophils Relative %: 54 %
Platelets: 240 10*3/uL (ref 150–400)
RBC: 4.62 MIL/uL (ref 3.87–5.11)
RDW: 13.4 % (ref 11.5–15.5)
WBC: 7.2 10*3/uL (ref 4.0–10.5)

## 2018-07-09 LAB — BASIC METABOLIC PANEL
Anion gap: 11 (ref 5–15)
BUN: 10 mg/dL (ref 6–20)
CHLORIDE: 106 mmol/L (ref 98–111)
CO2: 22 mmol/L (ref 22–32)
Calcium: 8.7 mg/dL — ABNORMAL LOW (ref 8.9–10.3)
Creatinine, Ser: 0.82 mg/dL (ref 0.44–1.00)
GFR calc Af Amer: >60 mL/min (ref >60)
GFR calc non Af Amer: >60 mL/min (ref >60)
GLUCOSE: 107 mg/dL — AB (ref 70–99)
Potassium: 3.3 mmol/L — ABNORMAL LOW (ref 3.5–5.1)
Sodium: 139 mmol/L (ref 135–145)

## 2018-07-09 LAB — TSH: TSH: 5.273 u[IU]/mL — AB (ref 0.350–4.500)

## 2018-07-09 NOTE — ED Notes (Signed)
Pt on monitor 

## 2018-07-09 NOTE — ED Triage Notes (Signed)
Pt reports heart racing since 2 am. Pt also c/o URI symptoms. Pt has hx of SVT. Pt took her metoprolol at 3 am.

## 2018-07-09 NOTE — ED Provider Notes (Signed)
[facility] EMERGENCY DEPARTMENT Provider Note   CSN: [id number] Arrival date & time: 07/09/18  0508     History   Chief Complaint Chief Complaint  Patient presents with  . Palpitations    HPI Krystal Arnold is a 47 y.o. female.  Patient is a 47 year old female with history of SVT and hypertension.  She presents today for evaluation of palpitations.  She woke this morning at approximately 2:30 feeling as though her heart was racing.  She took her metoprolol, however symptoms persist.  She denies any chest pain, difficulty breathing.  She does report recent URI symptoms.  The history is provided by the patient.  Palpitations   This is a recurrent problem. The current episode started 3 to 5 hours ago. Episode frequency: intermittently. The problem has been gradually improving. The problem is associated with an unknown factor. Pertinent negatives include no fever, no chest pressure, no dizziness and no cough.    Past Medical History:  Diagnosis Date  . Allergic rhinitis   . Hx of gestational diabetes mellitus, not currently pregnant   . Hyperhydrosis disorder   . Hypertension    after pregnancy  . Migraine   . SVT (supraventricular tachycardia) (HCC)   . Vitamin D deficiency     Patient Active Problem List   Diagnosis Date Noted  . Migraine 05/12/2013    Past Surgical History:  Procedure Laterality Date  . CESAREAN SECTION    . FOOT SURGERY    . WISDOM TOOTH EXTRACTION       OB History    Gravida  3   Para  2   Term  2   Preterm      AB      Living  2     SAB      TAB      Ectopic      Multiple      Live Births  2            Home Medications    Prior to Admission medications   Medication Sig Start Date End Date Taking? Authorizing Provider  amLODipine (NORVASC) 5 MG tablet Take 5 mg by mouth daily.   Yes [provider]  levonorgestrel (MIRENA) 20 MCG/24HR IUD 1 each by Intrauterine route as directed.   Yes [provider]  metoprolol succinate (TOPROL-XL) 100 MG 24 hr tablet Take 100 mg by mouth daily. Take with or immediately following a meal.   Yes [provider]    Family History Family History  Problem Relation Age of Onset  . Heart murmur Mother   . Heart disease Maternal Grandmother   . Hypertension Maternal Grandfather     Social History Social History   Tobacco Use  . Smoking status: Never Smoker  . Smokeless tobacco: Never Used  Substance Use Topics  . Alcohol use: Yes    Alcohol/week: 1.0 - 2.0 standard drinks    Types: 1 - 2 Shots of liquor per week    Comment: weekly  . Drug use: No     Allergies   Patient has no known allergies.   Review of Systems Review of Systems  Constitutional: Negative for fever.  Respiratory: Negative for cough.   Cardiovascular: Positive for palpitations.  Neurological: Negative for dizziness.  All other systems reviewed and are negative.    Physical Exam Updated Vital Signs BP (!) 155/96 (BP Location: Right Arm)   Pulse 88   Temp 98 F (36.7 C) (Oral)  Resp 18   Ht 5\' 5"  (1.651 m)   Wt 79.4 kg   SpO2 99%   BMI 29.12 kg/m   Physical Exam  Constitutional: She is oriented to person, place, and time. She appears well-developed and well-nourished. No distress.  HENT:  Head: Normocephalic and atraumatic.  Neck: Normal range of motion. Neck supple.  Cardiovascular: Normal rate and regular rhythm. Exam reveals no gallop and no friction rub.  No murmur heard. Pulmonary/Chest: Effort normal and breath sounds normal. No respiratory distress. She has no wheezes.  Abdominal: Soft. Bowel sounds are normal. She exhibits no distension. There is no tenderness.  Musculoskeletal: Normal range of motion.  Neurological: She is alert and oriented to person, place, and time.  Skin: Skin is warm and dry. She is not diaphoretic.  Nursing note and vitals reviewed.    ED Treatments / Results  Labs (all labs ordered are  listed, but only abnormal results are displayed) Labs Reviewed  BASIC METABOLIC PANEL  CBC WITH DIFFERENTIAL/PLATELET  TSH    ED ECG REPORT   Date: 07/09/2018  Rate: 83  Rhythm: normal sinus rhythm  QRS Axis: left  Intervals: normal  ST/T Wave abnormalities: nonspecific T wave changes  Conduction Disutrbances:none  Narrative Interpretation:   Old EKG Reviewed: none available  I have personally reviewed the EKG tracing and agree with the computerized printout as noted.   Radiology No results found.  Procedures Procedures (including critical care time)  Medications Ordered in ED Medications - No data to display   Initial Impression / Assessment and Plan / ED Course  I have reviewed the triage vital signs and the nursing notes.  Pertinent labs & imaging results that were available during my care of the patient were reviewed by me and considered in my medical decision making (see chart for details).  Patient presents here with palpitations.  She has a history of SVT, however no episodes of SVT have been witnessed this morning.  Her EKG shows a sinus rhythm and electrolytes are all essentially unremarkable.  TSH is pending.    She was observed in the ER with no further ectopy until just before discharge when she was found to have an episode of narrow complex, regular tachycardia.  It appears to be either PAT or SVT.  This lasted for several seconds, then spontaneously resolved.  At this point, I see no indication for admission.  I will advised her to cut back on the amount of decongestant she has been taking for her upper respiratory infection and limit caffeine intake.  She is to follow-up with her cardiologist if the symptoms persist.  She immediately reverted back to SR after this brief episode.  Final Clinical Impressions(s) / ED Diagnoses   Final diagnoses:  None    ED Discharge Orders    None       Geoffery Lyons, MD 07/09/18 [phone]

## 2018-07-09 NOTE — Discharge Instructions (Addendum)
Limit your caffeine intake as much as possible.  Stop taking the decongestants for the next several days and see if this reduces the frequency of your palpitations.  Follow-up with your cardiologist if symptoms persist, and return to the ER if symptoms significantly worsen or change.

## 2018-08-06 DIAGNOSIS — R6889 Other general symptoms and signs: Secondary | ICD-10-CM | POA: Diagnosis not present

## 2018-08-06 DIAGNOSIS — R509 Fever, unspecified: Secondary | ICD-10-CM | POA: Diagnosis not present

## 2018-08-09 DIAGNOSIS — B349 Viral infection, unspecified: Secondary | ICD-10-CM | POA: Diagnosis not present

## 2018-11-15 ENCOUNTER — Other Ambulatory Visit: Payer: Self-pay | Admitting: Family Medicine

## 2018-11-15 DIAGNOSIS — Z1231 Encounter for screening mammogram for malignant neoplasm of breast: Secondary | ICD-10-CM

## 2018-12-13 DIAGNOSIS — Z8679 Personal history of other diseases of the circulatory system: Secondary | ICD-10-CM | POA: Diagnosis not present

## 2018-12-13 DIAGNOSIS — N644 Mastodynia: Secondary | ICD-10-CM | POA: Diagnosis not present

## 2018-12-13 DIAGNOSIS — I10 Essential (primary) hypertension: Secondary | ICD-10-CM | POA: Diagnosis not present

## 2018-12-13 DIAGNOSIS — R7302 Impaired glucose tolerance (oral): Secondary | ICD-10-CM | POA: Diagnosis not present

## 2018-12-13 DIAGNOSIS — R7303 Prediabetes: Secondary | ICD-10-CM | POA: Diagnosis not present

## 2018-12-17 ENCOUNTER — Other Ambulatory Visit: Payer: Self-pay | Admitting: Obstetrics and Gynecology

## 2018-12-17 DIAGNOSIS — N644 Mastodynia: Secondary | ICD-10-CM

## 2018-12-17 DIAGNOSIS — Z01419 Encounter for gynecological examination (general) (routine) without abnormal findings: Secondary | ICD-10-CM | POA: Diagnosis not present

## 2018-12-17 DIAGNOSIS — R232 Flushing: Secondary | ICD-10-CM | POA: Diagnosis not present

## 2018-12-22 ENCOUNTER — Other Ambulatory Visit: Payer: BLUE CROSS/BLUE SHIELD

## 2018-12-23 DIAGNOSIS — J029 Acute pharyngitis, unspecified: Secondary | ICD-10-CM | POA: Diagnosis not present

## 2018-12-23 DIAGNOSIS — R52 Pain, unspecified: Secondary | ICD-10-CM | POA: Diagnosis not present

## 2018-12-24 ENCOUNTER — Other Ambulatory Visit: Payer: BLUE CROSS/BLUE SHIELD

## 2018-12-27 ENCOUNTER — Ambulatory Visit
Admission: RE | Admit: 2018-12-27 | Discharge: 2018-12-27 | Disposition: A | Discharge status: Home / Self Care | Payer: BLUE CROSS/BLUE SHIELD | Source: Ambulatory Visit | Attending: Obstetrics and Gynecology | Admitting: Obstetrics and Gynecology

## 2018-12-27 ENCOUNTER — Ambulatory Visit: Payer: BLUE CROSS/BLUE SHIELD

## 2018-12-27 DIAGNOSIS — R922 Inconclusive mammogram: Secondary | ICD-10-CM | POA: Diagnosis not present

## 2018-12-27 DIAGNOSIS — J029 Acute pharyngitis, unspecified: Secondary | ICD-10-CM | POA: Diagnosis not present

## 2018-12-27 DIAGNOSIS — J040 Acute laryngitis: Secondary | ICD-10-CM | POA: Diagnosis not present

## 2018-12-27 DIAGNOSIS — N644 Mastodynia: Secondary | ICD-10-CM

## 2019-01-07 DIAGNOSIS — Z30433 Encounter for removal and reinsertion of intrauterine contraceptive device: Secondary | ICD-10-CM | POA: Diagnosis not present

## 2019-01-07 DIAGNOSIS — Z3202 Encounter for pregnancy test, result negative: Secondary | ICD-10-CM | POA: Diagnosis not present

## 2019-02-14 DIAGNOSIS — Z30431 Encounter for routine checking of intrauterine contraceptive device: Secondary | ICD-10-CM | POA: Diagnosis not present

## 2019-05-05 DIAGNOSIS — I10 Essential (primary) hypertension: Secondary | ICD-10-CM | POA: Diagnosis not present

## 2019-05-05 DIAGNOSIS — Z0000 Encounter for general adult medical examination without abnormal findings: Secondary | ICD-10-CM | POA: Diagnosis not present

## 2019-05-05 DIAGNOSIS — G44209 Tension-type headache, unspecified, not intractable: Secondary | ICD-10-CM | POA: Diagnosis not present

## 2019-05-05 DIAGNOSIS — R7303 Prediabetes: Secondary | ICD-10-CM | POA: Diagnosis not present

## 2019-05-05 DIAGNOSIS — G43909 Migraine, unspecified, not intractable, without status migrainosus: Secondary | ICD-10-CM | POA: Diagnosis not present

## 2019-05-13 DIAGNOSIS — H524 Presbyopia: Secondary | ICD-10-CM | POA: Diagnosis not present

## 2019-06-10 DIAGNOSIS — Z20828 Contact with and (suspected) exposure to other viral communicable diseases: Secondary | ICD-10-CM | POA: Diagnosis not present

## 2019-06-17 DIAGNOSIS — D2210 Melanocytic nevi of unspecified eyelid, including canthus: Secondary | ICD-10-CM | POA: Diagnosis not present

## 2019-08-08 DIAGNOSIS — Z23 Encounter for immunization: Secondary | ICD-10-CM | POA: Diagnosis not present

## 2019-09-27 DIAGNOSIS — N920 Excessive and frequent menstruation with regular cycle: Secondary | ICD-10-CM | POA: Diagnosis not present

## 2019-10-03 DIAGNOSIS — Z8742 Personal history of other diseases of the female genital tract: Secondary | ICD-10-CM | POA: Diagnosis not present

## 2019-10-16 DIAGNOSIS — J029 Acute pharyngitis, unspecified: Secondary | ICD-10-CM | POA: Diagnosis not present

## 2019-10-16 DIAGNOSIS — R52 Pain, unspecified: Secondary | ICD-10-CM | POA: Diagnosis not present

## 2019-10-16 DIAGNOSIS — R6883 Chills (without fever): Secondary | ICD-10-CM | POA: Diagnosis not present

## 2019-10-16 DIAGNOSIS — J039 Acute tonsillitis, unspecified: Secondary | ICD-10-CM | POA: Diagnosis not present

## 2019-10-19 DIAGNOSIS — Z30433 Encounter for removal and reinsertion of intrauterine contraceptive device: Secondary | ICD-10-CM | POA: Diagnosis not present

## 2019-10-24 DIAGNOSIS — L680 Hirsutism: Secondary | ICD-10-CM | POA: Diagnosis not present

## 2019-10-24 DIAGNOSIS — R220 Localized swelling, mass and lump, head: Secondary | ICD-10-CM | POA: Diagnosis not present

## 2019-10-24 DIAGNOSIS — L218 Other seborrheic dermatitis: Secondary | ICD-10-CM | POA: Diagnosis not present

## 2019-12-01 DIAGNOSIS — L218 Other seborrheic dermatitis: Secondary | ICD-10-CM | POA: Diagnosis not present

## 2019-12-20 DIAGNOSIS — N939 Abnormal uterine and vaginal bleeding, unspecified: Secondary | ICD-10-CM | POA: Diagnosis not present

## 2019-12-20 DIAGNOSIS — Z01419 Encounter for gynecological examination (general) (routine) without abnormal findings: Secondary | ICD-10-CM | POA: Diagnosis not present

## 2019-12-20 DIAGNOSIS — Z309 Encounter for contraceptive management, unspecified: Secondary | ICD-10-CM | POA: Diagnosis not present

## 2019-12-28 DIAGNOSIS — Z30431 Encounter for routine checking of intrauterine contraceptive device: Secondary | ICD-10-CM | POA: Diagnosis not present

## 2019-12-28 DIAGNOSIS — N939 Abnormal uterine and vaginal bleeding, unspecified: Secondary | ICD-10-CM | POA: Diagnosis not present

## 2020-01-12 DIAGNOSIS — Z30432 Encounter for removal of intrauterine contraceptive device: Secondary | ICD-10-CM | POA: Diagnosis not present

## 2020-01-12 DIAGNOSIS — Z309 Encounter for contraceptive management, unspecified: Secondary | ICD-10-CM | POA: Diagnosis not present

## 2020-02-15 DIAGNOSIS — Z309 Encounter for contraceptive management, unspecified: Secondary | ICD-10-CM | POA: Diagnosis not present

## 2020-02-15 DIAGNOSIS — N939 Abnormal uterine and vaginal bleeding, unspecified: Secondary | ICD-10-CM | POA: Diagnosis not present

## 2020-03-26 DIAGNOSIS — I10 Essential (primary) hypertension: Secondary | ICD-10-CM | POA: Diagnosis not present

## 2020-03-26 DIAGNOSIS — K5901 Slow transit constipation: Secondary | ICD-10-CM | POA: Diagnosis not present

## 2020-03-26 DIAGNOSIS — R7303 Prediabetes: Secondary | ICD-10-CM | POA: Diagnosis not present

## 2020-03-26 DIAGNOSIS — G43909 Migraine, unspecified, not intractable, without status migrainosus: Secondary | ICD-10-CM | POA: Diagnosis not present

## 2020-06-12 IMAGING — MG DIGITAL DIAGNOSTIC BILATERAL MAMMOGRAM WITH TOMO AND CAD
8 series · 8 of 24 positions shown · non-contrast
Comparison: Previous exam(s).

CLINICAL DATA: Diffuse lateral right breast pain with shooting
pains in the right nipple for the past 2 weeks, improving.She
discontinued caffeine and reports no pain or other symptoms at this
time.

EXAM:
DIGITAL DIAGNOSTIC BILATERAL MAMMOGRAM WITH CAD AND TOMO

[L MLO synth-2D]
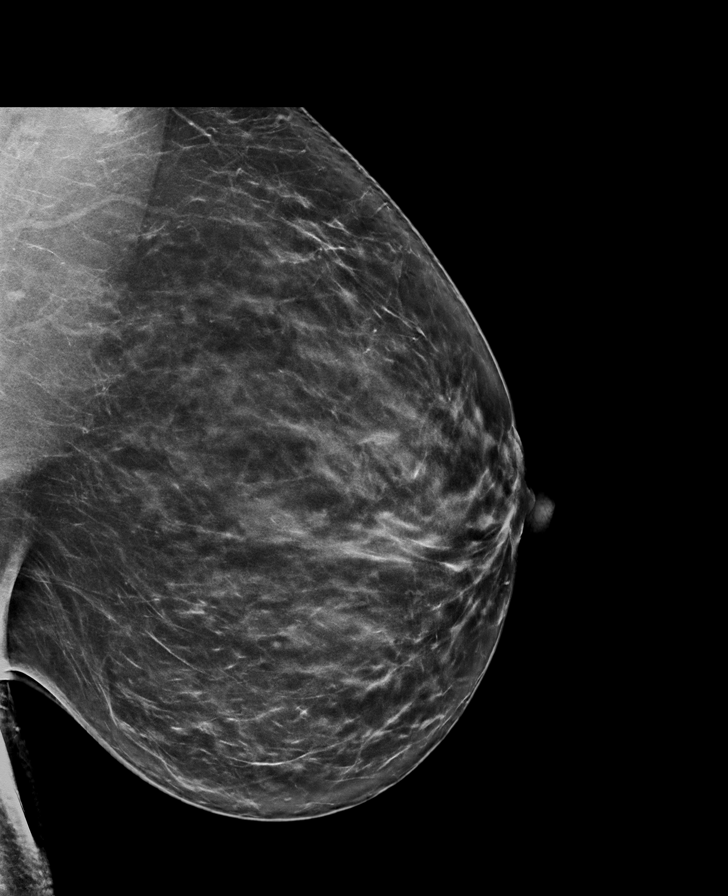

[R MLO synth-2D]
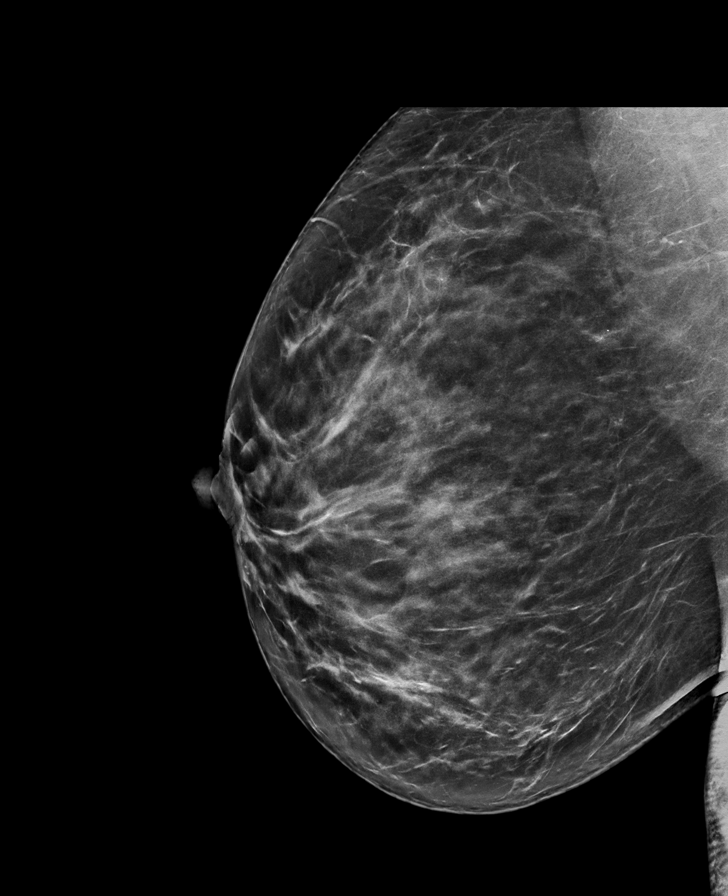

[L CC synth-2D]
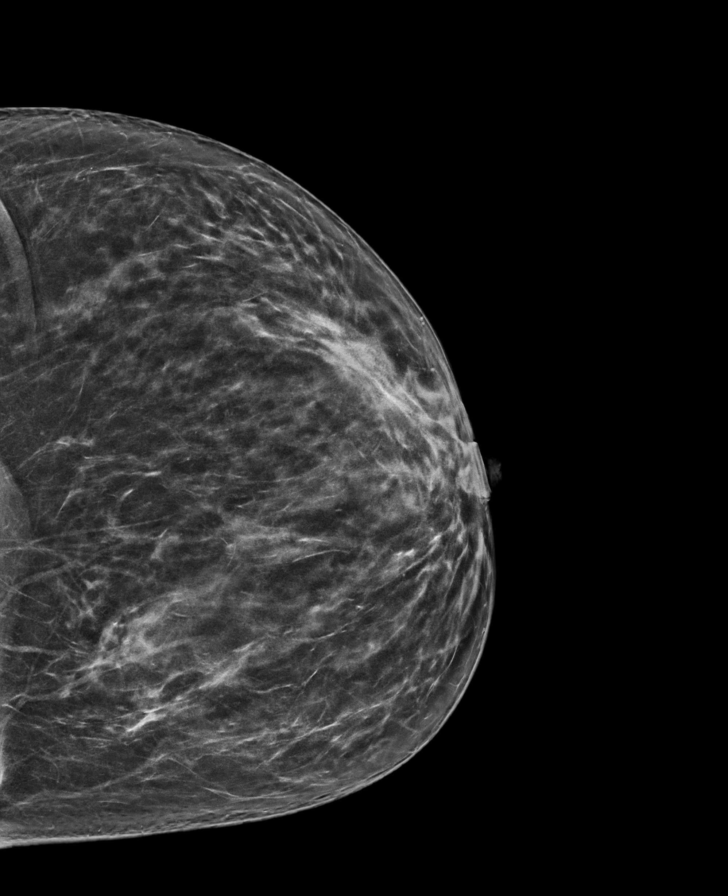

[R CC synth-2D]
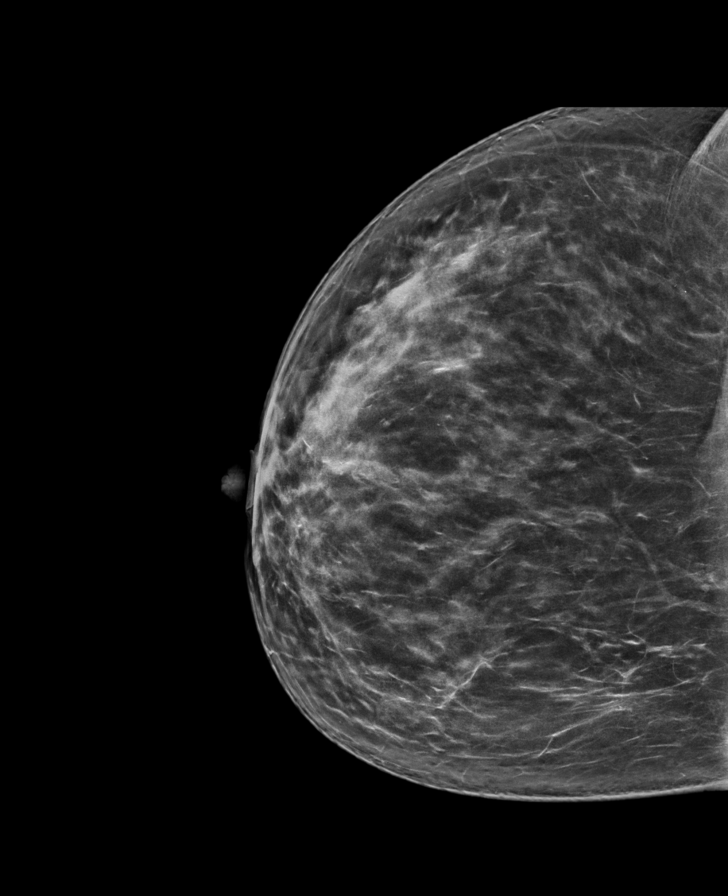

[R MLO tomo · tomo slice 44/87.0]
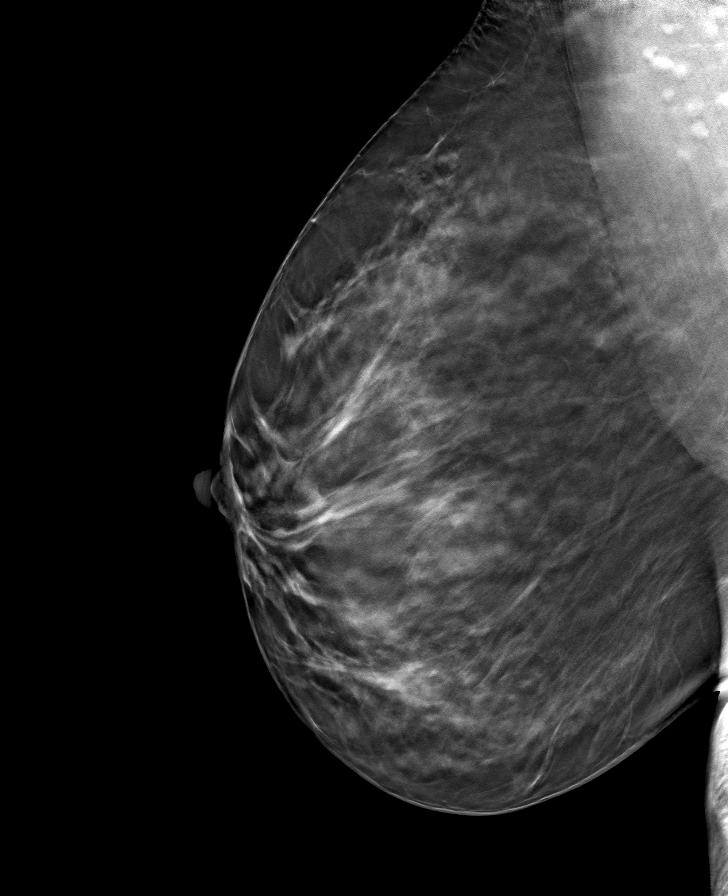

[L MLO tomo · tomo slice 45/88.0]
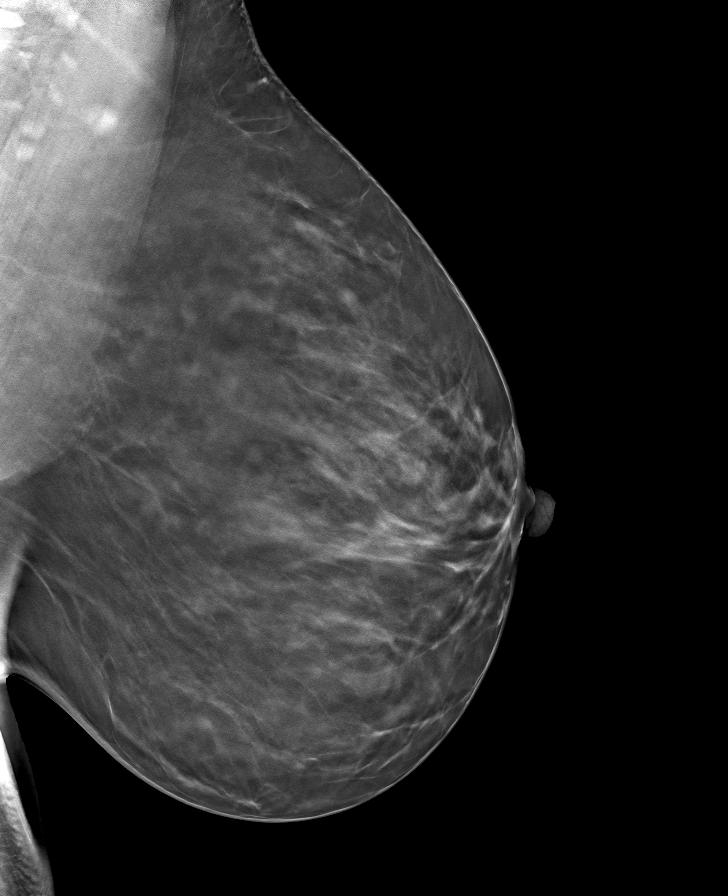

[L CC tomo · tomo slice 39/78.0]
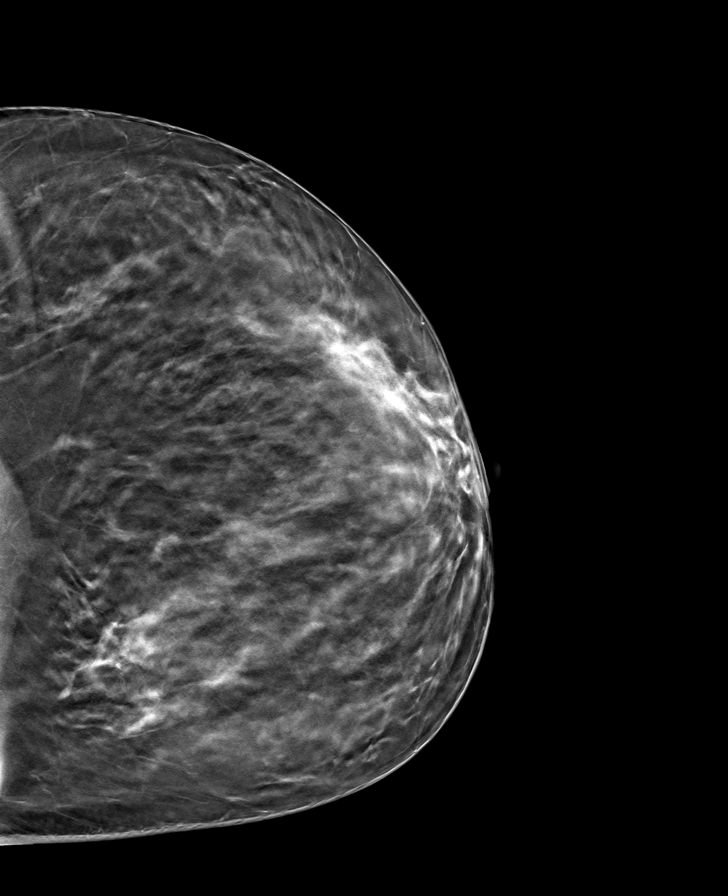

[R CC tomo · tomo slice 43/86.0]
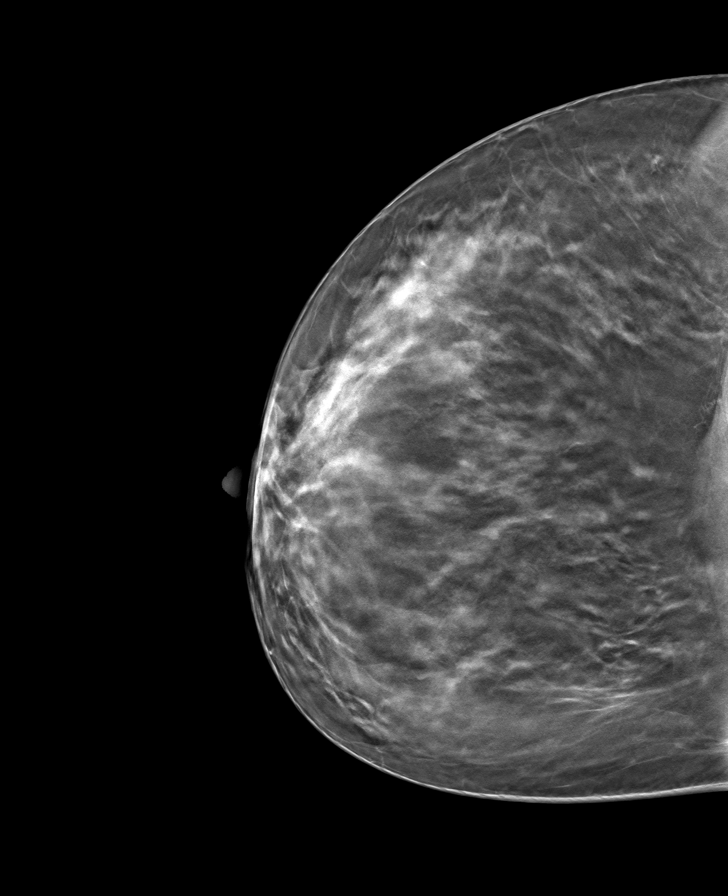

[8 of 24 positions shown; findings below may reference images not displayed]

ACR Breast Density Category c: The breast tissue is heterogeneously
dense, which may obscure small masses.
FINDINGS: Stable mammographic appearance of the breasts with no findings
suspicious for malignancy in either breast.

Mammographic images were processed with CAD.
IMPRESSION: No evidence of malignancy.

RECOMMENDATION:
Bilateral screening mammogram in 1 year.

I have discussed the findings and recommendations with the patient.
Results were also provided in writing at the conclusion of the
visit. If applicable, a reminder letter will be sent to the patient
regarding the next appointment.

BI-RADS CATEGORY  1: Negative.

## 2020-06-18 DIAGNOSIS — D508 Other iron deficiency anemias: Secondary | ICD-10-CM | POA: Diagnosis not present

## 2020-06-18 DIAGNOSIS — I10 Essential (primary) hypertension: Secondary | ICD-10-CM | POA: Diagnosis not present

## 2020-06-18 DIAGNOSIS — R7303 Prediabetes: Secondary | ICD-10-CM | POA: Diagnosis not present

## 2020-06-28 ENCOUNTER — Institutional Professional Consult (permissible substitution): Payer: BLUE CROSS/BLUE SHIELD | Admitting: Plastic Surgery

## 2020-06-28 ENCOUNTER — Institutional Professional Consult (permissible substitution): Payer: BC Managed Care – PPO | Admitting: Plastic Surgery

## 2020-07-19 DIAGNOSIS — D509 Iron deficiency anemia, unspecified: Secondary | ICD-10-CM | POA: Diagnosis not present

## 2020-07-19 DIAGNOSIS — I10 Essential (primary) hypertension: Secondary | ICD-10-CM | POA: Diagnosis not present

## 2020-08-30 ENCOUNTER — Other Ambulatory Visit: Payer: Self-pay

## 2020-08-30 ENCOUNTER — Encounter: Payer: Self-pay | Admitting: Plastic Surgery

## 2020-08-30 ENCOUNTER — Ambulatory Visit: Payer: BC Managed Care – PPO | Admitting: Plastic Surgery

## 2020-08-30 VITALS — BP 114/83 | HR 110 | Temp 98.4°F | Ht 65.0 in | Wt 165.0 lb

## 2020-08-30 DIAGNOSIS — L723 Sebaceous cyst: Secondary | ICD-10-CM | POA: Diagnosis not present

## 2020-08-30 NOTE — Progress Notes (Signed)
° °  Referring Provider Laurann Montana, MD [phone] [place],  [place] [address]   CC:  Chief Complaint  Patient presents with   Consult      Krystal Arnold is an 49 y.o. female.  HPI: Patient presents with a cystic lesion in the glabella.  Is been present for 10 years and seems to be growing in size.  It intermittently painful.  She would like to have it removed.  She was sent by her dermatologist.  No Known Allergies  Outpatient Encounter Medications as of 08/30/2020  Medication Sig   FEROSUL 325 (65 Fe) MG tablet Take 325 mg by mouth daily.   losartan-hydrochlorothiazide (HYZAAR) 100-12.5 MG tablet Take 1 tablet by mouth daily.   potassium chloride (KLOR-CON) 10 MEQ tablet Take 10 mEq by mouth daily.   amLODipine (NORVASC) 5 MG tablet Take 5 mg by mouth daily.   levonorgestrel (MIRENA) 20 MCG/24HR IUD 1 each by Intrauterine route as directed.   metoprolol succinate (TOPROL-XL) 100 MG 24 hr tablet Take 100 mg by mouth daily. Take with or immediately following a meal. (Patient not taking: Reported on 08/30/2020)   No facility-administered encounter medications on file as of 08/30/2020.     Past Medical History:  Diagnosis Date   Allergic rhinitis    Hx of gestational diabetes mellitus, not currently pregnant    Hyperhydrosis disorder    Hypertension    after pregnancy   Migraine    SVT (supraventricular tachycardia) (HCC)    Vitamin D deficiency     Past Surgical History:  Procedure Laterality Date   CESAREAN SECTION     FOOT SURGERY     WISDOM TOOTH EXTRACTION      Family History  Problem Relation Age of Onset   Heart murmur Mother    Heart disease Maternal Grandmother    Hypertension Maternal Grandfather    Breast cancer Maternal Aunt    Breast cancer Cousin     Social History   Social History Narrative   Not on file     Review of Systems General: Denies fevers, chills, weight loss CV: Denies chest pain,  shortness of breath, palpitations  Physical Exam Vitals with BMI 08/30/2020 07/09/2018 07/09/2018  Height 5\' 5"  - 5\' 5"   Weight 165 lbs - 175 lbs  BMI 27.46 - 29.12  Systolic 114 110 -  Diastolic 83 83 -  Pulse 110 73 -    General:  No acute distress,  Alert and oriented, Non-Toxic, Normal speech and affect Examination shows a 1 to 2 cm cystic lesion in the left side of the glabella.  There is minimal overlying skin changes.  It is a firm subcutaneous nodular lesion.  Assessment/Plan Patient presents with a cystic lesion of the left glabella.  This probably sebaceous cyst.  We discussed excision in.  We discussed the risk that include bleeding, infection, damage surrounding structures and need for additional procedures.  We discussed the potential for recurrence.  We discussed in detail the location and orientation of the scar.  All of her questions were answered and we will plan to do this under local in the office.  08/30/2020, 5:04 PM

## 2020-09-19 DIAGNOSIS — I471 Supraventricular tachycardia: Secondary | ICD-10-CM | POA: Diagnosis not present

## 2020-09-19 DIAGNOSIS — I10 Essential (primary) hypertension: Secondary | ICD-10-CM | POA: Diagnosis not present

## 2020-09-19 DIAGNOSIS — E876 Hypokalemia: Secondary | ICD-10-CM | POA: Diagnosis not present

## 2020-10-03 ENCOUNTER — Other Ambulatory Visit: Payer: Self-pay

## 2020-10-03 ENCOUNTER — Other Ambulatory Visit (HOSPITAL_COMMUNITY)
Admission: RE | Admit: 2020-10-03 | Discharge: 2020-10-03 | Disposition: A | Discharge status: Home / Self Care | Payer: BC Managed Care – PPO | Source: Ambulatory Visit | Attending: Plastic Surgery | Admitting: Plastic Surgery

## 2020-10-03 ENCOUNTER — Encounter: Payer: Self-pay | Admitting: Plastic Surgery

## 2020-10-03 ENCOUNTER — Ambulatory Visit: Payer: BC Managed Care – PPO | Admitting: Plastic Surgery

## 2020-10-03 VITALS — BP 124/84 | HR 82

## 2020-10-03 DIAGNOSIS — L720 Epidermal cyst: Secondary | ICD-10-CM | POA: Diagnosis not present

## 2020-10-03 DIAGNOSIS — L723 Sebaceous cyst: Secondary | ICD-10-CM | POA: Diagnosis not present

## 2020-10-03 NOTE — Progress Notes (Signed)
Operative Note   DATE OF OPERATION: 10/03/2020  LOCATION:    SURGICAL DEPARTMENT: Plastic Surgery  PREOPERATIVE DIAGNOSES:  Forehead cyst  POSTOPERATIVE DIAGNOSES:  same  PROCEDURE:  1. Excision of forehead cyst measuring 1.5 cm 2. Complex closure measuring 1.5 cm  SURGEON: Ancil Linsey, MD  ANESTHESIA:  Local  COMPLICATIONS: None.   INDICATIONS FOR PROCEDURE:  The patient, Krystal Arnold is a 49 y.o. female born on 12/25/1970, is here for treatment of forehead cyst. MRN: [id number]  CONSENT:  Informed consent was obtained directly from the patient. Risks, benefits and alternatives were fully discussed. Specific risks including but not limited to bleeding, infection, hematoma, seroma, scarring, pain, infection, wound healing problems, and need for further surgery were all discussed. The patient did have an ample opportunity to have questions answered to satisfaction.   DESCRIPTION OF PROCEDURE:  Local anesthesia was administered. The patient's operative site was prepped and draped in a sterile fashion. A time out was performed and all information was confirmed to be correct.  The lesion was excised with a 15 blade.  Hemostasis was obtained.  Circumferential undermining was performed and the skin was advanced and closed in layers with interrupted buried Monocryl sutures and 5-0 fast gut for the skin.  The lesion excised measured 1.5 cm, and the total length of closure measured 1.5 cm.    The patient tolerated the procedure well.  There were no complications.

## 2020-10-08 LAB — SURGICAL PATHOLOGY

## 2020-12-31 DIAGNOSIS — J069 Acute upper respiratory infection, unspecified: Secondary | ICD-10-CM | POA: Diagnosis not present

## 2020-12-31 DIAGNOSIS — Z20822 Contact with and (suspected) exposure to covid-19: Secondary | ICD-10-CM | POA: Diagnosis not present

## 2021-05-13 ENCOUNTER — Emergency Department
Admission: EM | Admit: 2021-05-13 | Discharge: 2021-05-13 | Disposition: A | Discharge status: Home / Self Care | Payer: BC Managed Care – PPO | Attending: Emergency Medicine | Admitting: Emergency Medicine

## 2021-05-13 ENCOUNTER — Other Ambulatory Visit: Payer: Self-pay

## 2021-05-13 DIAGNOSIS — H5711 Ocular pain, right eye: Secondary | ICD-10-CM | POA: Diagnosis not present

## 2021-05-13 DIAGNOSIS — R519 Headache, unspecified: Secondary | ICD-10-CM | POA: Diagnosis not present

## 2021-05-13 DIAGNOSIS — Z5321 Procedure and treatment not carried out due to patient leaving prior to being seen by health care provider: Secondary | ICD-10-CM | POA: Insufficient documentation

## 2021-05-13 DIAGNOSIS — S0501XA Injury of conjunctiva and corneal abrasion without foreign body, right eye, initial encounter: Secondary | ICD-10-CM | POA: Diagnosis not present

## 2021-05-13 DIAGNOSIS — W25XXXA Contact with sharp glass, initial encounter: Secondary | ICD-10-CM | POA: Insufficient documentation

## 2021-05-13 NOTE — ED Triage Notes (Signed)
Pt here after her glasses scratched her eye after opening a door. Pt states right eye is painful and hard to open due to pain. Pt also c/o headache.

## 2021-05-13 NOTE — ED Notes (Signed)
Patient leaving a this time- reports her eye doctor contacted her and she is seeking care with them.

## 2021-05-15 DIAGNOSIS — S0501XA Injury of conjunctiva and corneal abrasion without foreign body, right eye, initial encounter: Secondary | ICD-10-CM | POA: Diagnosis not present

## 2021-05-17 DIAGNOSIS — S0501XD Injury of conjunctiva and corneal abrasion without foreign body, right eye, subsequent encounter: Secondary | ICD-10-CM | POA: Diagnosis not present

## 2021-05-29 DIAGNOSIS — N939 Abnormal uterine and vaginal bleeding, unspecified: Secondary | ICD-10-CM | POA: Diagnosis not present

## 2021-05-29 DIAGNOSIS — R7303 Prediabetes: Secondary | ICD-10-CM | POA: Diagnosis not present

## 2021-05-29 DIAGNOSIS — I10 Essential (primary) hypertension: Secondary | ICD-10-CM | POA: Diagnosis not present

## 2021-05-29 DIAGNOSIS — Z1159 Encounter for screening for other viral diseases: Secondary | ICD-10-CM | POA: Diagnosis not present

## 2021-05-29 DIAGNOSIS — D509 Iron deficiency anemia, unspecified: Secondary | ICD-10-CM | POA: Diagnosis not present

## 2021-06-05 DIAGNOSIS — N920 Excessive and frequent menstruation with regular cycle: Secondary | ICD-10-CM | POA: Diagnosis not present

## 2021-06-13 DIAGNOSIS — N921 Excessive and frequent menstruation with irregular cycle: Secondary | ICD-10-CM | POA: Diagnosis not present

## 2021-06-13 DIAGNOSIS — D259 Leiomyoma of uterus, unspecified: Secondary | ICD-10-CM | POA: Diagnosis not present

## 2021-06-24 ENCOUNTER — Other Ambulatory Visit: Payer: Self-pay | Admitting: Obstetrics and Gynecology

## 2021-06-24 DIAGNOSIS — N939 Abnormal uterine and vaginal bleeding, unspecified: Secondary | ICD-10-CM | POA: Diagnosis not present

## 2021-06-24 DIAGNOSIS — Z3202 Encounter for pregnancy test, result negative: Secondary | ICD-10-CM | POA: Diagnosis not present

## 2021-06-24 DIAGNOSIS — N921 Excessive and frequent menstruation with irregular cycle: Secondary | ICD-10-CM | POA: Diagnosis not present

## 2021-09-19 DIAGNOSIS — Z23 Encounter for immunization: Secondary | ICD-10-CM | POA: Diagnosis not present

## 2022-03-20 DIAGNOSIS — I10 Essential (primary) hypertension: Secondary | ICD-10-CM | POA: Diagnosis not present

## 2022-03-20 DIAGNOSIS — N920 Excessive and frequent menstruation with regular cycle: Secondary | ICD-10-CM | POA: Diagnosis not present

## 2022-03-20 DIAGNOSIS — D508 Other iron deficiency anemias: Secondary | ICD-10-CM | POA: Diagnosis not present

## 2022-03-20 DIAGNOSIS — R7303 Prediabetes: Secondary | ICD-10-CM | POA: Diagnosis not present

## 2022-04-09 DIAGNOSIS — M5116 Intervertebral disc disorders with radiculopathy, lumbar region: Secondary | ICD-10-CM | POA: Diagnosis not present

## 2022-04-09 DIAGNOSIS — M9903 Segmental and somatic dysfunction of lumbar region: Secondary | ICD-10-CM | POA: Diagnosis not present

## 2022-04-09 DIAGNOSIS — M9905 Segmental and somatic dysfunction of pelvic region: Secondary | ICD-10-CM | POA: Diagnosis not present

## 2022-04-09 DIAGNOSIS — M25551 Pain in right hip: Secondary | ICD-10-CM | POA: Diagnosis not present

## 2022-04-18 DIAGNOSIS — D509 Iron deficiency anemia, unspecified: Secondary | ICD-10-CM | POA: Diagnosis not present

## 2022-04-28 DIAGNOSIS — M47819 Spondylosis without myelopathy or radiculopathy, site unspecified: Secondary | ICD-10-CM | POA: Diagnosis not present

## 2022-06-02 DIAGNOSIS — K625 Hemorrhage of anus and rectum: Secondary | ICD-10-CM | POA: Diagnosis not present

## 2022-06-02 DIAGNOSIS — D509 Iron deficiency anemia, unspecified: Secondary | ICD-10-CM | POA: Diagnosis not present

## 2022-06-02 DIAGNOSIS — K5909 Other constipation: Secondary | ICD-10-CM | POA: Diagnosis not present

## 2022-06-17 DIAGNOSIS — D509 Iron deficiency anemia, unspecified: Secondary | ICD-10-CM | POA: Diagnosis not present

## 2022-07-11 DIAGNOSIS — D509 Iron deficiency anemia, unspecified: Secondary | ICD-10-CM | POA: Diagnosis not present

## 2022-08-07 DIAGNOSIS — Z1151 Encounter for screening for human papillomavirus (HPV): Secondary | ICD-10-CM | POA: Diagnosis not present

## 2022-08-07 DIAGNOSIS — N920 Excessive and frequent menstruation with regular cycle: Secondary | ICD-10-CM | POA: Diagnosis not present

## 2022-08-07 DIAGNOSIS — D649 Anemia, unspecified: Secondary | ICD-10-CM | POA: Diagnosis not present

## 2022-08-07 DIAGNOSIS — D259 Leiomyoma of uterus, unspecified: Secondary | ICD-10-CM | POA: Diagnosis not present

## 2022-08-07 DIAGNOSIS — Z01419 Encounter for gynecological examination (general) (routine) without abnormal findings: Secondary | ICD-10-CM | POA: Diagnosis not present

## 2022-08-07 DIAGNOSIS — Z124 Encounter for screening for malignant neoplasm of cervix: Secondary | ICD-10-CM | POA: Diagnosis not present

## 2022-08-13 ENCOUNTER — Telehealth: Payer: Self-pay | Admitting: Pharmacy Technician

## 2022-08-13 ENCOUNTER — Other Ambulatory Visit: Payer: Self-pay

## 2022-08-13 DIAGNOSIS — D509 Iron deficiency anemia, unspecified: Secondary | ICD-10-CM | POA: Insufficient documentation

## 2022-08-13 NOTE — Telephone Encounter (Signed)
Feraheme is a non preferred medication and will likely be denied due to patient has not tried and or failed step therapy.  Dr. Harlan Stains of [facility] has been notified. Awaiting response Phone: [phone] Fax: [phone]

## 2022-08-15 ENCOUNTER — Other Ambulatory Visit: Payer: Self-pay

## 2022-08-15 ENCOUNTER — Telehealth: Payer: Self-pay | Admitting: Pharmacy Technician

## 2022-08-15 NOTE — Telephone Encounter (Signed)
Auth Submission: NO AUTH NEEDED Payer: [employer] Medication & CPT/J Code(s) submitted: Venofer (Iron Sucrose) J1756 Route of submission (phone, fax, portal):  Phone # Fax # Auth type: Buy/Bill Units/visits requested:  Reference number:  Approval from: 08/15/22 to 11/09/22

## 2022-08-29 ENCOUNTER — Encounter (HOSPITAL_COMMUNITY): Payer: Self-pay

## 2022-08-29 ENCOUNTER — Other Ambulatory Visit: Payer: Self-pay

## 2022-08-29 ENCOUNTER — Ambulatory Visit (INDEPENDENT_AMBULATORY_CARE_PROVIDER_SITE_OTHER): Payer: BC Managed Care – PPO

## 2022-08-29 ENCOUNTER — Ambulatory Visit (INDEPENDENT_AMBULATORY_CARE_PROVIDER_SITE_OTHER): Payer: BC Managed Care – PPO | Admitting: Internal Medicine

## 2022-08-29 ENCOUNTER — Emergency Department (HOSPITAL_COMMUNITY)
Admission: EM | Admit: 2022-08-29 | Discharge: 2022-08-29 | Disposition: A | Discharge status: Home / Self Care | Payer: BC Managed Care – PPO | Attending: Emergency Medicine | Admitting: Emergency Medicine

## 2022-08-29 VITALS — BP 113/82 | HR 92 | Temp 97.4°F | Resp 18 | Ht 65.0 in | Wt 151.8 lb

## 2022-08-29 DIAGNOSIS — T7840XA Allergy, unspecified, initial encounter: Secondary | ICD-10-CM | POA: Diagnosis not present

## 2022-08-29 DIAGNOSIS — I959 Hypotension, unspecified: Secondary | ICD-10-CM | POA: Diagnosis not present

## 2022-08-29 DIAGNOSIS — R0600 Dyspnea, unspecified: Secondary | ICD-10-CM | POA: Diagnosis not present

## 2022-08-29 DIAGNOSIS — D509 Iron deficiency anemia, unspecified: Secondary | ICD-10-CM | POA: Diagnosis not present

## 2022-08-29 DIAGNOSIS — T454X5A Adverse effect of iron and its compounds, initial encounter: Secondary | ICD-10-CM | POA: Diagnosis not present

## 2022-08-29 DIAGNOSIS — R609 Edema, unspecified: Secondary | ICD-10-CM | POA: Diagnosis not present

## 2022-08-29 DIAGNOSIS — R42 Dizziness and giddiness: Secondary | ICD-10-CM | POA: Diagnosis not present

## 2022-08-29 LAB — I-STAT CHEM 8, ED
BUN: 12 mg/dL (ref 6–20)
Calcium, Ion: 1.05 mmol/L — ABNORMAL LOW (ref 1.15–1.40)
Chloride: 108 mmol/L (ref 98–111)
Creatinine, Ser: 0.6 mg/dL (ref 0.44–1.00)
Glucose, Bld: 121 mg/dL — ABNORMAL HIGH (ref 70–99)
HCT: 42 % (ref 36.0–46.0)
Hemoglobin: 14.3 g/dL (ref 12.0–15.0)
Potassium: 3.9 mmol/L (ref 3.5–5.1)
Sodium: 139 mmol/L (ref 135–145)
TCO2: 20 mmol/L — ABNORMAL LOW (ref 22–32)

## 2022-08-29 LAB — COMPREHENSIVE METABOLIC PANEL
ALT: 15 U/L (ref 0–44)
AST: 33 U/L (ref 15–41)
Albumin: 3.2 g/dL — ABNORMAL LOW (ref 3.5–5.0)
Alkaline Phosphatase: 44 U/L (ref 38–126)
Anion gap: 8 (ref 5–15)
BUN: 11 mg/dL (ref 6–20)
CO2: 18 mmol/L — ABNORMAL LOW (ref 22–32)
Calcium: 7.7 mg/dL — ABNORMAL LOW (ref 8.9–10.3)
Chloride: 111 mmol/L (ref 98–111)
Creatinine, Ser: 0.86 mg/dL (ref 0.44–1.00)
GFR, Estimated: >60 mL/min (ref >60)
Glucose, Bld: 120 mg/dL — ABNORMAL HIGH (ref 70–99)
Potassium: 3.5 mmol/L (ref 3.5–5.1)
Sodium: 137 mmol/L (ref 135–145)
Total Bilirubin: 0.7 mg/dL (ref 0.3–1.2)
Total Protein: 6.5 g/dL (ref 6.5–8.1)

## 2022-08-29 LAB — CBC WITH DIFFERENTIAL/PLATELET
Abs Immature Granulocytes: 0.03 10*3/uL (ref 0.00–0.07)
Basophils Absolute: 0.1 10*3/uL (ref 0.0–0.1)
Basophils Relative: 1 %
Eosinophils Absolute: 0 10*3/uL (ref 0.0–0.5)
Eosinophils Relative: 0 %
HCT: 40.5 % (ref 36.0–46.0)
Hemoglobin: 12.3 g/dL (ref 12.0–15.0)
Immature Granulocytes: 0 %
Lymphocytes Relative: 6 %
Lymphs Abs: 0.6 10*3/uL — ABNORMAL LOW (ref 0.7–4.0)
MCH: 24.2 pg — ABNORMAL LOW (ref 26.0–34.0)
MCHC: 30.4 g/dL (ref 30.0–36.0)
MCV: 79.6 fL — ABNORMAL LOW (ref 80.0–100.0)
Monocytes Absolute: 0.3 10*3/uL (ref 0.1–1.0)
Monocytes Relative: 2 %
Neutro Abs: 9.9 10*3/uL — ABNORMAL HIGH (ref 1.7–7.7)
Neutrophils Relative %: 91 %
Platelets: 417 10*3/uL — ABNORMAL HIGH (ref 150–400)
RBC: 5.09 MIL/uL (ref 3.87–5.11)
RDW: 21.9 % — ABNORMAL HIGH (ref 11.5–15.5)
WBC: 10.9 10*3/uL — ABNORMAL HIGH (ref 4.0–10.5)
nRBC: 0 % (ref 0.0–0.2)

## 2022-08-29 MED ORDER — FAMOTIDINE 20 MG PO TABS: 20.0000 mg | ORAL_TABLET | Freq: Two times a day (BID) | ORAL | 0 refills | Status: DC

## 2022-08-29 MED ORDER — DIPHENHYDRAMINE HCL 50 MG/ML IJ SOLN
50.0000 mg | Freq: Once | INTRAMUSCULAR | Status: AC | PRN
  Administered 2022-08-29: 50 mg via INTRAVENOUS

## 2022-08-29 MED ORDER — DIPHENHYDRAMINE HCL 50 MG/ML IJ SOLN
25.0000 mg | Freq: Once | INTRAMUSCULAR | Status: AC
  Administered 2022-08-29: 25 mg via INTRAVENOUS
  Filled 2022-08-29: qty 1

## 2022-08-29 MED ORDER — ALBUTEROL SULFATE HFA 108 (90 BASE) MCG/ACT IN AERS: 2.0000 | INHALATION_SPRAY | Freq: Once | RESPIRATORY_TRACT | Status: DC | PRN

## 2022-08-29 MED ORDER — EPINEPHRINE 0.3 MG/0.3ML IJ SOAJ: 0.3000 mg | Freq: Once | INTRAMUSCULAR | Status: DC | PRN

## 2022-08-29 MED ORDER — SODIUM CHLORIDE 0.9 % IV BOLUS
1000.0000 mL | Freq: Once | INTRAVENOUS | Status: AC
  Administered 2022-08-29: 1000 mL via INTRAVENOUS

## 2022-08-29 MED ORDER — METHYLPREDNISOLONE SODIUM SUCC 125 MG IJ SOLR
125.0000 mg | Freq: Once | INTRAMUSCULAR | Status: AC | PRN
  Administered 2022-08-29: 125 mg via INTRAVENOUS

## 2022-08-29 MED ORDER — FAMOTIDINE IN NACL 20-0.9 MG/50ML-% IV SOLN
20.0000 mg | Freq: Once | INTRAVENOUS | Status: AC | PRN
  Administered 2022-08-29: 20 mg via INTRAVENOUS

## 2022-08-29 MED ORDER — PREDNISONE 20 MG PO TABS: ORAL_TABLET | ORAL | 0 refills | Status: DC

## 2022-08-29 MED ORDER — SODIUM CHLORIDE 0.9 % IV SOLN: Freq: Once | INTRAVENOUS | Status: AC | PRN

## 2022-08-29 MED ORDER — SODIUM CHLORIDE 0.9 % IV SOLN
500.0000 mg | Freq: Once | INTRAVENOUS | Status: AC
  Administered 2022-08-29: 500 mg via INTRAVENOUS
  Filled 2022-08-29: qty 25

## 2022-08-29 NOTE — Progress Notes (Signed)
Documentation of rapid response note.  I was in the pulmonary clinic nearby.  Patient was in the infusion center [facility].  The infusion center is nearby.  Upon arrival the nurse indicated that patient had received the first dose of iron infusion and was towards the end of the infusion when she started complaining of burning at the IV site and then shortly thereafter started getting short of breath.  There is no wheezing or rash.  Patient also became hypotensive.  At the point I had arrived patient had received ranitidine and also Solu-Medrol 125 mg.  IV fluids was being started.  Patient blood pressure went as low as 48/01 systolic.  At the time of my arrival blood pressure had improved to 95 systolic and a MAP greater than 65.  Fluids was just being started.  Patient was feeling uncomfortable and nauseated but there was no rash or wheezing or stridor.   Anaphylaxis is highly likely when any ONE of the following three criteria is fulfilled:  1. Acute onset of an illness (minutes to several hours) with involvement of the skin, mucosal tissue, or both (eg, generalized hives, pruritus or flushing, swollen lips-tongue-uvula)  AND AT LEAST ONE OF THE FOLLOWING: A. Respiratory compromise (eg, dyspnea, wheeze-bronchospasm, stridor, hypoxemia) B. Reduced BP* or associated symptoms of end-organ dysfunction (eg, hypotonia, collapse, syncope, incontinence)  2. TWO OR MORE OF THE FOLLOWING that occur rapidly after exposure to a LIKELY allergen for that patient (minutes to several hours): A. Involvement of the skin mucosal tissue (eg, generalized hives, itch-flush, swollen lips-tongue-uvula) B. Respiratory compromise (eg, dyspnea, wheeze-bronchospasm, stridor, hypoxemia) C. Reduced BP* or associated symptoms (eg, hypotonia, collapse, syncope, incontinence) D. Persistent gastrointestinal symptoms (eg, crampy abdominal pain, vomiting)  3. Reduced BP* after exposure to a KNOWN allergen for that patient (minutes to  several hours): A. Infants and children - Low systolic BP (age-specific)* or greater than 30% decrease in systolic BP B. Adults - Systolic BP of less than 90 mmHg or greater than 30% decrease from that person's baseline  xxxxxxx A -very likely anaphylaxis  Plan - Continue current management Call emergency services Go to the ER She will need tryptase level checked within the next few hours in the emergency department.   30 minutes critical care service time  SIGNATURE    Dr. Brand Males, M.D., F.C.C.P,  Pulmonary and Critical Care Medicine Staff Physician, [place] Director - Interstitial Lung Disease  Program  Medical Director - [facility] [place], [place], [address]   Pager: [phone], If no answer  -[place] or [place] [phone] Telephone (clinical office): [phone] Telephone (research): [phone]  2:53 PM 08/29/2022

## 2022-08-29 NOTE — Progress Notes (Signed)
Diagnosis: Iron Deficiency Anemia  Provider:  Marshell Garfinkel MD  Procedure: Infusion  IV Type: Peripheral, IV Location: L Forearm  Venofer (Iron Sucrose), Dose: 500 mg  Infusion Start Time: 5784  6962 Pt C/O hand tightness with some swelling. Assess hand grip/strength noticed left hand weaker. Stopped the infusion.Pt felt dizzy and light headed. Pt also C/O back spasm and nausea. VS obtained BP  80/60, Pulse 110, Pt pale,diaphoretic. Emergency meds Solumedrol 125 mg and benadryl 50 mg IV push given.Pepcid 20 mg IVPB and NS bolus given. Patient evaluated by Dr Chase Caller and advised pt to transfer to ED for further evaluation. EMS arrived and pt transported to ED.  Infusion Stop Time: 1420 ,  Approximately 50 ml of remaining Venofer discarded  NS, Dose: 1000 ml  Infusion Start Time: 1430  Infusion Stop Time: Pt transfer to ED via EMS  Post Infusion IV Care: IV site @ rt hand intact ,NS infusing.  Discharge: Transfer pt to ED Via EMS: ,  Performed by:  Arnoldo Morale, RN

## 2022-08-29 NOTE — ED Triage Notes (Addendum)
Pt coming from the infusion center via EMS with c/o iron infusion reaction. Per EMS pt received 450 ml of infusion before feeling tightness and swelling in hands, mainly left hand where infusion was going in. Pt states she then stood up started feeling dizzy, sat back down and started feeling clammy and nauseous. Pt also c/o back spasms. BP initially 80/50. This was the pt's first infusion.  Meds given at infusion center: 50 mg benadryl IV 125 mg solumedrol IV 20 mg pepcid IV 500 ml bolus NS 0.9%

## 2022-08-29 NOTE — Discharge Instructions (Signed)
Take Benadryl 25 mg every 6 hours as needed for itching or swelling.  Keep your hands elevated and follow-up with your doctor next week

## 2022-08-29 NOTE — ED Provider Notes (Signed)
[facility] DEPT Provider Note   CSN: [id number] Arrival date & time: 08/29/22  1525     History {Add pertinent medical, surgical, social history, OB history to HPI:1} Chief Complaint  Patient presents with   Allergic Reaction    Krystal Arnold is a 51 y.o. female.  Patient had an allergic reaction secondary to iron infusion today she was sent over to the emergency department   Allergic Reaction      Home Medications Prior to Admission medications   Medication Sig Start Date End Date Taking? Authorizing Provider  amLODipine (NORVASC) 5 MG tablet Take 5 mg by mouth daily. Patient not taking: Reported on 10/03/2020    [provider]  FEROSUL 325 (65 Fe) MG tablet Take 325 mg by mouth daily. 07/27/20   [provider]  levonorgestrel (MIRENA) 20 MCG/24HR IUD 1 each by Intrauterine route as directed. Patient not taking: Reported on 10/03/2020    [provider]  losartan-hydrochlorothiazide (HYZAAR) 100-12.5 MG tablet Take 1 tablet by mouth daily. 08/18/20   [provider]  metoprolol succinate (TOPROL-XL) 100 MG 24 hr tablet Take 100 mg by mouth daily. Take with or immediately following a meal.     [provider]  potassium chloride (KLOR-CON) 10 MEQ tablet Take 10 mEq by mouth daily. 08/21/20   [provider]      Allergies    Patient has no known allergies.    Review of Systems   Review of Systems  Physical Exam Updated Vital Signs BP 108/86 (BP Location: Left Arm)   Pulse 89   Temp 98.4 F (36.9 C) (Oral)   Resp 18   Ht 5\' 5"  (1.651 m)   Wt 68 kg   LMP 08/03/2022   SpO2 97%   BMI 24.96 kg/m  Physical Exam  ED Results / Procedures / Treatments   Labs (all labs ordered are listed, but only abnormal results are displayed) Labs Reviewed  CBC WITH DIFFERENTIAL/PLATELET - Abnormal; Notable for the following components:      Result Value   WBC 10.9 (*)    MCV 79.6 (*)     MCH 24.2 (*)    RDW 21.9 (*)    Platelets 417 (*)    All other components within normal limits  I-STAT CHEM 8, ED - Abnormal; Notable for the following components:   Glucose, Bld 121 (*)    Calcium, Ion 1.05 (*)    TCO2 20 (*)    All other components within normal limits  COMPREHENSIVE METABOLIC PANEL  TYPE AND SCREEN    EKG None  Radiology No results found.  Procedures Procedures  {Document cardiac monitor, telemetry assessment procedure when appropriate:1}  Medications Ordered in ED Medications  sodium chloride 0.9 % bolus 1,000 mL (1,000 mLs Intravenous New Bag/Given 08/29/22 1636)  diphenhydrAMINE (BENADRYL) injection 25 mg (25 mg Intravenous Given 08/29/22 1636)    ED Course/ Medical Decision Making/ A&P                           Medical Decision Making Amount and/or Complexity of Data Reviewed Labs: ordered.  Risk Prescription drug management.   Patient with an allergic reaction.  {Document critical care time when appropriate:1} {Document review of labs and clinical decision tools ie heart score, Chads2Vasc2 etc:1}  {Document your independent review of radiology images, and any outside records:1} {Document your discussion with family members, caretakers, and with consultants:1} {Document social determinants  of health affecting pt's care:1} {Document your decision making why or why not admission, treatments were needed:1} Final Clinical Impression(s) / ED Diagnoses Final diagnoses:  None    Rx / DC Orders ED Discharge Orders     None

## 2022-09-09 DIAGNOSIS — D259 Leiomyoma of uterus, unspecified: Secondary | ICD-10-CM | POA: Diagnosis not present

## 2022-09-09 DIAGNOSIS — N920 Excessive and frequent menstruation with regular cycle: Secondary | ICD-10-CM | POA: Diagnosis not present

## 2022-09-09 DIAGNOSIS — D500 Iron deficiency anemia secondary to blood loss (chronic): Secondary | ICD-10-CM | POA: Diagnosis not present

## 2022-09-22 DIAGNOSIS — I10 Essential (primary) hypertension: Secondary | ICD-10-CM | POA: Diagnosis not present

## 2022-09-22 DIAGNOSIS — D509 Iron deficiency anemia, unspecified: Secondary | ICD-10-CM | POA: Diagnosis not present

## 2022-09-22 DIAGNOSIS — F439 Reaction to severe stress, unspecified: Secondary | ICD-10-CM | POA: Diagnosis not present

## 2022-09-22 DIAGNOSIS — I471 Supraventricular tachycardia, unspecified: Secondary | ICD-10-CM | POA: Diagnosis not present

## 2022-09-22 DIAGNOSIS — Z23 Encounter for immunization: Secondary | ICD-10-CM | POA: Diagnosis not present

## 2022-09-22 DIAGNOSIS — N921 Excessive and frequent menstruation with irregular cycle: Secondary | ICD-10-CM | POA: Diagnosis not present

## 2023-01-23 ENCOUNTER — Encounter: Payer: Self-pay | Admitting: Pulmonary Disease

## 2023-03-26 ENCOUNTER — Encounter (HOSPITAL_COMMUNITY): Payer: Self-pay | Admitting: *Deleted

## 2023-03-26 ENCOUNTER — Emergency Department (HOSPITAL_COMMUNITY)
Admission: EM | Admit: 2023-03-26 | Discharge: 2023-03-26 | Disposition: A | Discharge status: Home / Self Care | Payer: BC Managed Care – PPO | Attending: Emergency Medicine | Admitting: Emergency Medicine

## 2023-03-26 ENCOUNTER — Other Ambulatory Visit: Payer: Self-pay

## 2023-03-26 ENCOUNTER — Emergency Department (HOSPITAL_COMMUNITY): Payer: BC Managed Care – PPO

## 2023-03-26 DIAGNOSIS — D649 Anemia, unspecified: Secondary | ICD-10-CM | POA: Diagnosis not present

## 2023-03-26 DIAGNOSIS — R000 Tachycardia, unspecified: Secondary | ICD-10-CM | POA: Insufficient documentation

## 2023-03-26 DIAGNOSIS — R634 Abnormal weight loss: Secondary | ICD-10-CM | POA: Insufficient documentation

## 2023-03-26 DIAGNOSIS — R5383 Other fatigue: Secondary | ICD-10-CM | POA: Diagnosis not present

## 2023-03-26 DIAGNOSIS — N920 Excessive and frequent menstruation with regular cycle: Secondary | ICD-10-CM | POA: Diagnosis not present

## 2023-03-26 DIAGNOSIS — R0602 Shortness of breath: Secondary | ICD-10-CM | POA: Diagnosis not present

## 2023-03-26 DIAGNOSIS — Z79899 Other long term (current) drug therapy: Secondary | ICD-10-CM | POA: Insufficient documentation

## 2023-03-26 LAB — BASIC METABOLIC PANEL
Anion gap: 11 (ref 5–15)
BUN: 11 mg/dL (ref 6–20)
CO2: 19 mmol/L — ABNORMAL LOW (ref 22–32)
Calcium: 8.7 mg/dL — ABNORMAL LOW (ref 8.9–10.3)
Chloride: 106 mmol/L (ref 98–111)
Creatinine, Ser: 0.97 mg/dL (ref 0.44–1.00)
GFR, Estimated: >60 mL/min (ref >60)
Glucose, Bld: 106 mg/dL — ABNORMAL HIGH (ref 70–99)
Potassium: 3.8 mmol/L (ref 3.5–5.1)
Sodium: 136 mmol/L (ref 135–145)

## 2023-03-26 LAB — CBC WITH DIFFERENTIAL/PLATELET
Abs Immature Granulocytes: 0.03 10*3/uL (ref 0.00–0.07)
Basophils Absolute: 0.1 10*3/uL (ref 0.0–0.1)
Basophils Relative: 1 %
Eosinophils Absolute: 0 10*3/uL (ref 0.0–0.5)
Eosinophils Relative: 1 %
HCT: 24.8 % — ABNORMAL LOW (ref 36.0–46.0)
Hemoglobin: 7.4 g/dL — ABNORMAL LOW (ref 12.0–15.0)
Immature Granulocytes: 0 %
Lymphocytes Relative: 19 %
Lymphs Abs: 1.5 10*3/uL (ref 0.7–4.0)
MCH: 23.9 pg — ABNORMAL LOW (ref 26.0–34.0)
MCHC: 29.8 g/dL — ABNORMAL LOW (ref 30.0–36.0)
MCV: 80.3 fL (ref 80.0–100.0)
Monocytes Absolute: 0.6 10*3/uL (ref 0.1–1.0)
Monocytes Relative: 8 %
Neutro Abs: 5.6 10*3/uL (ref 1.7–7.7)
Neutrophils Relative %: 71 %
Platelets: 327 10*3/uL (ref 150–400)
RBC: 3.09 MIL/uL — ABNORMAL LOW (ref 3.87–5.11)
RDW: 20.3 % — ABNORMAL HIGH (ref 11.5–15.5)
WBC: 7.8 10*3/uL (ref 4.0–10.5)
nRBC: 0 % (ref 0.0–0.2)

## 2023-03-26 LAB — ABO/RH: ABO/RH(D): O POS

## 2023-03-26 LAB — RETICULOCYTES
Immature Retic Fract: 27.7 % — ABNORMAL HIGH (ref 2.3–15.9)
RBC.: 3.15 MIL/uL — ABNORMAL LOW (ref 3.87–5.11)
Retic Count, Absolute: 54.2 10*3/uL (ref 19.0–186.0)
Retic Ct Pct: 1.7 % (ref 0.4–3.1)

## 2023-03-26 LAB — IRON AND TIBC
Iron: 10 ug/dL — ABNORMAL LOW (ref 28–170)
Saturation Ratios: 2 % — ABNORMAL LOW (ref 10.4–31.8)
TIBC: 430 ug/dL (ref 250–450)
UIBC: 420 ug/dL

## 2023-03-26 LAB — FERRITIN: Ferritin: 5 ng/mL — ABNORMAL LOW (ref 11–307)

## 2023-03-26 LAB — HEMOGLOBIN AND HEMATOCRIT, BLOOD
HCT: 25.3 % — ABNORMAL LOW (ref 36.0–46.0)
Hemoglobin: 7.5 g/dL — ABNORMAL LOW (ref 12.0–15.0)

## 2023-03-26 LAB — TYPE AND SCREEN
ABO/RH(D): O POS
Antibody Screen: NEGATIVE

## 2023-03-26 LAB — FOLATE: Folate: 29.8 ng/mL (ref >5.9)

## 2023-03-26 LAB — TROPONIN I (HIGH SENSITIVITY): Troponin I (High Sensitivity): 4 ng/L (ref <18)

## 2023-03-26 LAB — VITAMIN B12: Vitamin B-12: 670 pg/mL (ref 180–914)

## 2023-03-26 MED ORDER — MEDROXYPROGESTERONE ACETATE 10 MG PO TABS: 20.0000 mg | ORAL_TABLET | Freq: Every day | ORAL | 0 refills | Status: DC

## 2023-03-26 NOTE — ED Notes (Signed)
PA is in with pt, await orders

## 2023-03-26 NOTE — ED Provider Triage Note (Signed)
Emergency Medicine Provider Triage Evaluation Note  Krystal Arnold , a 52 y.o. female  was evaluated in triage.  Pt complains of shortness of breath, generalized fatigue.  Patient states that she has a history of anemia secondary to heavy menstrual bleeding.  States she is on medication to prevent heavy menstrual bleeding but is unable to remember name of medication.  States that she has been having increased menstrual cycles per month.  States that she is felt worsening shortness of breath and generalized fatigue since Tuesday of this week.  States that she feels short of breath with exertion and is relieved with rest.  Denies any chest pain, cough, abdominal pain, urinary symptoms, change in bowel habits.  Review of Systems  Positive: See above Negative:   Physical Exam  BP 101/76 (BP Location: Left Arm)   Pulse (!) 118   Temp 97.8 F (36.6 C)   Resp 16   SpO2 100%  Gen:   Awake, no distress   Resp:  Normal effort  MSK:   Moves extremities without difficulty  Other:    Medical Decision Making  Medically screening exam initiated at 12:08 PM.  Appropriate orders placed.  Krystal Arnold was informed that the remainder of the evaluation will be completed by another provider, this initial triage assessment does not replace that evaluation, and the importance of remaining in the ED until their evaluation is complete.    Peter Garter, [place] 03/26/23 1210

## 2023-03-26 NOTE — ED Triage Notes (Signed)
Pt is here for fatigue and general weakness since Tuesday.  Pt reports heavy menstrual periods

## 2023-03-26 NOTE — Discharge Instructions (Addendum)
Your hemoglobin is stable ~7.4. Stop lysteda (transexamic acid) and start provera 20 mg daily for 10 days. Follow up on Friday or Monday with gynecology. Schedule outpatient colonoscopy. Return for worsening signs or symptoms.

## 2023-03-26 NOTE — ED Provider Notes (Signed)
[place] EMERGENCY DEPARTMENT AT [facility] Provider Note   CSN: [id number] Arrival date & time: 03/26/23  1147     History  Chief Complaint  Patient presents with   Fatigue    Krystal Arnold is a 52 y.o. female.  Patient with history of anemia, heavy menstrual bleeding for years, C-section history presents with fatigue and concern for anemia.  Patient was sent over by gynecology.  Patient is compliant with iron, trans exam and acid and blood pressure medications.  Primary bleeding is vaginal however patient has had intermittent blood in the stools no history of colonoscopy.  No fever or abdominal pain, intermittent cramps with her menstrual cycles.  Menstrual cycles typically last 9 to 10 days with multiple pads every hour.  Patient is planning on surgery with gynecology in the near future.       Home Medications Prior to Admission medications   Medication Sig Start Date End Date Taking? Authorizing Provider  medroxyPROGESTERone (PROVERA) 10 MG tablet Take 2 tablets (20 mg total) by mouth daily for 10 days. 03/26/23 04/05/23 Yes Blane Ohara, MD  amLODipine (NORVASC) 5 MG tablet Take 5 mg by mouth daily. Patient not taking: Reported on 10/03/2020    [provider]  famotidine (PEPCID) 20 MG tablet Take 1 tablet (20 mg total) by mouth 2 (two) times daily. 08/29/22   Bethann Berkshire, MD  FEROSUL 325 (65 Fe) MG tablet Take 325 mg by mouth daily. 07/27/20   [provider]  levonorgestrel (MIRENA) 20 MCG/24HR IUD 1 each by Intrauterine route as directed. Patient not taking: Reported on 10/03/2020    [provider]  losartan-hydrochlorothiazide (HYZAAR) 100-12.5 MG tablet Take 1 tablet by mouth daily. 08/18/20   [provider]  metoprolol succinate (TOPROL-XL) 100 MG 24 hr tablet Take 100 mg by mouth daily. Take with or immediately following a meal.     [provider]  potassium chloride (KLOR-CON) 10 MEQ tablet Take 10 mEq by  mouth daily. 08/21/20   [provider]  predniSONE (DELTASONE) 20 MG tablet 2 tabs po daily x 3 days 08/29/22   Bethann Berkshire, MD      Allergies    Patient has no known allergies.    Review of Systems   Review of Systems  Constitutional:  Positive for fatigue. Negative for chills and fever.  HENT:  Negative for congestion.   Eyes:  Negative for visual disturbance.  Respiratory:  Negative for shortness of breath.   Cardiovascular:  Negative for chest pain.  Gastrointestinal:  Negative for abdominal pain and vomiting.  Genitourinary:  Positive for vaginal bleeding. Negative for dysuria and flank pain.  Musculoskeletal:  Negative for back pain, neck pain and neck stiffness.  Skin:  Negative for rash.  Neurological:  Negative for light-headedness and headaches.    Physical Exam Updated Vital Signs BP 100/86   Pulse 98   Temp 98.7 F (37.1 C) (Oral)   Resp 18   SpO2 100%  Physical Exam Vitals and nursing note reviewed.  Constitutional:      General: She is not in acute distress.    Appearance: She is well-developed.  HENT:     Head: Normocephalic and atraumatic.     Mouth/Throat:     Mouth: Mucous membranes are moist.  Eyes:     General:        Right eye: No discharge.        Left eye: No discharge.     Conjunctiva/sclera:  Conjunctivae normal.  Neck:     Trachea: No tracheal deviation.  Cardiovascular:     Rate and Rhythm: Regular rhythm. Tachycardia present.     Heart sounds: No murmur heard. Pulmonary:     Effort: Pulmonary effort is normal.     Breath sounds: Normal breath sounds.  Abdominal:     General: There is no distension.     Palpations: Abdomen is soft.     Tenderness: There is no abdominal tenderness. There is no guarding.  Musculoskeletal:     Cervical back: Normal range of motion and neck supple. No rigidity.  Skin:    General: Skin is warm.     Capillary Refill: Capillary refill takes less than 2 seconds.     Coloration: Skin is pale.      Findings: No rash.  Neurological:     General: No focal deficit present.     Mental Status: She is alert.     Cranial Nerves: No cranial nerve deficit.  Psychiatric:        Mood and Affect: Mood normal.     ED Results / Procedures / Treatments   Labs (all labs ordered are listed, but only abnormal results are displayed) Labs Reviewed  BASIC METABOLIC PANEL - Abnormal; Notable for the following components:      Result Value   CO2 19 (*)    Glucose, Bld 106 (*)    Calcium 8.7 (*)    All other components within normal limits  CBC WITH DIFFERENTIAL/PLATELET - Abnormal; Notable for the following components:   RBC 3.09 (*)    Hemoglobin 7.4 (*)    HCT 24.8 (*)    MCH 23.9 (*)    MCHC 29.8 (*)    RDW 20.3 (*)    All other components within normal limits  HEMOGLOBIN AND HEMATOCRIT, BLOOD - Abnormal; Notable for the following components:   Hemoglobin 7.5 (*)    HCT 25.3 (*)    All other components within normal limits  RETICULOCYTES - Abnormal; Notable for the following components:   RBC. 3.15 (*)    Immature Retic Fract 27.7 (*)    All other components within normal limits  VITAMIN B12  FOLATE  IRON AND TIBC  FERRITIN  TYPE AND SCREEN  ABO/RH  TROPONIN I (HIGH SENSITIVITY)    EKG EKG Interpretation  Date/Time:  Thursday Mar 26 2023 12:25:12 EDT Ventricular Rate:  118 PR Interval:  130 QRS Duration: 68 QT Interval:  360 QTC Calculation: 504 R Axis:   17 Text Interpretation: Sinus tachycardia Otherwise normal ECG When compared with ECG of 09-Jul-2018 05:24, PREVIOUS ECG IS PRESENT Confirmed by Blane Ohara [phone]) on 03/26/2023 4:48:12 PM  Radiology DG Chest 1 View  Result Date: 03/26/2023 CLINICAL DATA:  Shortness of breath EXAM: CHEST  1 VIEW COMPARISON:  None Available. FINDINGS: The heart size and mediastinal contours are within normal limits. Both lungs are clear. The visualized skeletal structures are unremarkable. IMPRESSION: No active disease.  Electronically Signed   By: Lorenza Cambridge M.D.   On: 03/26/2023 13:34    Procedures Procedures    Medications Ordered in ED Medications - No data to display  ED Course/ Medical Decision Making/ A&P                             Medical Decision Making Amount and/or Complexity of Data Reviewed Labs: ordered.  Risk Prescription drug management.   Patient presents with  clinical concern for symptomatic anemia given significant menstrual bleeding and duration.  Other differentials include metabolic, bleeding from gastrointestinal, thyroid related, malignancy, other.  Hemoglobin returned independently reviewed 7.4, repeat H&H showed 7.5.  Patient at baseline for menstrual bleeding.  Other blood work overall reassuring electrolytes unremarkable.  Normal white blood cell count.  Reviewed medical records patient was 14.3 in October and 11 in November.  Discussed case with gynecology on-call Dr. Steva Ready will ensure close follow-up outpatient and with hemoglobin stable in the sevens recommends stopping transischemic acid and starting Provera 20 mg daily for 10 days.  Discussed this with the patient and prescription given.  Chest x-ray reviewed independently unremarkable no acute findings.  Plan for discharge.  Iron studies sent for outpatient follow-up and patient to follow-up with gastroenterology to arrange colonoscopy.        Final Clinical Impression(s) / ED Diagnoses Final diagnoses:  Symptomatic anemia  Weight loss  Menorrhagia with regular cycle    Rx / DC Orders ED Discharge Orders          Ordered    medroxyPROGESTERone (PROVERA) 10 MG tablet  Daily        03/26/23 1734              Blane Ohara, MD 03/26/23 1828

## 2023-04-03 DIAGNOSIS — D649 Anemia, unspecified: Secondary | ICD-10-CM | POA: Diagnosis not present

## 2023-04-03 DIAGNOSIS — N921 Excessive and frequent menstruation with irregular cycle: Secondary | ICD-10-CM | POA: Diagnosis not present

## 2023-04-03 DIAGNOSIS — D259 Leiomyoma of uterus, unspecified: Secondary | ICD-10-CM | POA: Diagnosis not present

## 2023-04-14 DIAGNOSIS — D259 Leiomyoma of uterus, unspecified: Secondary | ICD-10-CM | POA: Diagnosis not present

## 2023-04-14 DIAGNOSIS — N921 Excessive and frequent menstruation with irregular cycle: Secondary | ICD-10-CM | POA: Diagnosis not present

## 2023-04-14 DIAGNOSIS — D649 Anemia, unspecified: Secondary | ICD-10-CM | POA: Diagnosis not present

## 2023-04-15 ENCOUNTER — Other Ambulatory Visit: Payer: Self-pay | Admitting: Obstetrics and Gynecology

## 2023-04-15 ENCOUNTER — Other Ambulatory Visit: Payer: Self-pay

## 2023-04-15 ENCOUNTER — Observation Stay (HOSPITAL_COMMUNITY)
Admission: RE | Admit: 2023-04-15 | Discharge: 2023-04-16 | Disposition: A | Discharge status: Home / Self Care | Payer: BC Managed Care – PPO | Attending: Obstetrics and Gynecology | Admitting: Obstetrics and Gynecology

## 2023-04-15 ENCOUNTER — Encounter (HOSPITAL_COMMUNITY): Payer: Self-pay | Admitting: Obstetrics and Gynecology

## 2023-04-15 DIAGNOSIS — D649 Anemia, unspecified: Principal | ICD-10-CM | POA: Diagnosis present

## 2023-04-15 DIAGNOSIS — D259 Leiomyoma of uterus, unspecified: Secondary | ICD-10-CM | POA: Insufficient documentation

## 2023-04-15 DIAGNOSIS — N939 Abnormal uterine and vaginal bleeding, unspecified: Secondary | ICD-10-CM | POA: Diagnosis not present

## 2023-04-15 DIAGNOSIS — I10 Essential (primary) hypertension: Secondary | ICD-10-CM | POA: Diagnosis not present

## 2023-04-15 DIAGNOSIS — Z79899 Other long term (current) drug therapy: Secondary | ICD-10-CM | POA: Diagnosis not present

## 2023-04-15 LAB — TYPE AND SCREEN: Unit division: 0

## 2023-04-15 LAB — CBC
HCT: 18.9 % — ABNORMAL LOW (ref 36.0–46.0)
Hemoglobin: 5.6 g/dL — CL (ref 12.0–15.0)
MCH: 22.6 pg — ABNORMAL LOW (ref 26.0–34.0)
MCHC: 29.6 g/dL — ABNORMAL LOW (ref 30.0–36.0)
MCV: 76.2 fL — ABNORMAL LOW (ref 80.0–100.0)
Platelets: 326 10*3/uL (ref 150–400)
RBC: 2.48 MIL/uL — ABNORMAL LOW (ref 3.87–5.11)
RDW: 20 % — ABNORMAL HIGH (ref 11.5–15.5)
WBC: 4.7 10*3/uL (ref 4.0–10.5)
nRBC: 0 % (ref 0.0–0.2)

## 2023-04-15 LAB — BPAM RBC: Unit Type and Rh: 5100

## 2023-04-15 LAB — PREPARE RBC (CROSSMATCH)

## 2023-04-15 MED ORDER — ONDANSETRON HCL 4 MG PO TABS: 4.0000 mg | ORAL_TABLET | Freq: Four times a day (QID) | ORAL | Status: DC | PRN

## 2023-04-15 MED ORDER — ACETAMINOPHEN 325 MG PO TABS
650.0000 mg | ORAL_TABLET | Freq: Once | ORAL | Status: AC
  Administered 2023-04-15: 650 mg via ORAL
  Filled 2023-04-15: qty 2

## 2023-04-15 MED ORDER — BISACODYL 5 MG PO TBEC: 5.0000 mg | DELAYED_RELEASE_TABLET | Freq: Every day | ORAL | Status: DC | PRN

## 2023-04-15 MED ORDER — ONDANSETRON HCL 4 MG/2ML IJ SOLN: 4.0000 mg | Freq: Four times a day (QID) | INTRAMUSCULAR | Status: DC | PRN

## 2023-04-15 MED ORDER — SODIUM CHLORIDE 0.9% FLUSH: 3.0000 mL | INTRAVENOUS | Status: DC | PRN

## 2023-04-15 MED ORDER — MEDROXYPROGESTERONE ACETATE 10 MG PO TABS
20.0000 mg | ORAL_TABLET | Freq: Every day | ORAL | Status: DC
  Administered 2023-04-15 – 2023-04-16 (×2): 20 mg via ORAL
  Filled 2023-04-15 (×2): qty 2

## 2023-04-15 MED ORDER — SODIUM CHLORIDE 0.9% IV SOLUTION: Freq: Once | INTRAVENOUS | Status: AC

## 2023-04-15 MED ORDER — SODIUM CHLORIDE 0.9 % IV SOLN: 250.0000 mL | INTRAVENOUS | Status: DC | PRN

## 2023-04-15 MED ORDER — ZOLPIDEM TARTRATE 5 MG PO TABS
5.0000 mg | ORAL_TABLET | Freq: Every evening | ORAL | Status: DC | PRN
  Administered 2023-04-16: 5 mg via ORAL
  Filled 2023-04-15: qty 1

## 2023-04-15 MED ORDER — DIPHENHYDRAMINE HCL 25 MG PO CAPS
25.0000 mg | ORAL_CAPSULE | Freq: Once | ORAL | Status: AC
  Administered 2023-04-15: 25 mg via ORAL
  Filled 2023-04-15: qty 1

## 2023-04-15 MED ORDER — IBUPROFEN 600 MG PO TABS: 600.0000 mg | ORAL_TABLET | Freq: Four times a day (QID) | ORAL | Status: DC | PRN

## 2023-04-15 MED ORDER — SODIUM CHLORIDE 0.9% FLUSH: 3.0000 mL | Freq: Two times a day (BID) | INTRAVENOUS | Status: DC

## 2023-04-15 NOTE — H&P (Deleted)
  The note originally documented on this encounter has been moved the the encounter in which it belongs.  

## 2023-04-15 NOTE — H&P (Signed)
Subjective:    Chief Complaint(s): abnormal uterine bleeding/ U/S SOB    HPI:      General 52 y/o presents to discuss ultrasound results performed today 04/14/2023 to evaluate menorrhagia.  she report curent spotting she is not bleeding heavily however she is SOB. she was on provera 20 mg daily however has run out of the medication.  Her ultrasound today 04/15/2023 reveals a uterus measuring 9.3 cm x 5.5 cm x 6.1 cm. ( 3 fibroids are measured  the largest is 2.1 cm.  The endometrium is 3.9 mm.  the right ovary is normal. the left ovary has a simple cyst 3.4 cm.   she is planning robotic hysterectomy with bilateral salpingectomy as definitive therapy. she desires to have this done in October.   IN Review visit 04/03/2023: 52 y/o presents for ED follow-up due to heavy bleeding.  she was bleeding heavy and experienced  SOB/ dizziness on Mar 26, 2023. Menses started May 11th. she was changing a overnight pad ( 3 at a time) every 15 minutes for up to an hour and then it would slow down and get heavier again. she had hygiene accidenta as well.  she has a history of an allergic reaction to iron infusion. she has been on the provera 20 mg . SOB has improved. she still feels tired.  Yesterday she changed her pad every 4 hours. (It was not saturated) today she has only required pantiliners. she is taking an over the counter Iron supplement.   IN REVIEW:  Patient with known history of AUB. She was seen in the ED 03/26/2023 for heavy bleeding. Lysteda is not working. Hgb baseline is 11 and on two checks in the ED the Hgb was stable at 7.4 (repeat 7.5 after multiple hours). She was discharged home with Provera 20mg daily x 10 days.      Current Medication:     Taking Cyclobenzaprine HCl 10 MG Tablet TAKE 1/2 TO 1 TABLET ORALLY NEEDED FOR SPASM 30 DAYS , Notes to Pharmacist: Needs O/V before additional refills can be approved. Trulance(Plecanatide) 3 MG Tablet 1 tablet Orally on the weekends. Multivitamin .  Tablet 1 tablet by mouth Once daily. Flonase(Fluticasone Propionate) 50 MCG/ACT Suspension 1 spray in each nostril Nasally once a day if needed , Notes to Pharmacist: prn. ZyrTEC Allergy 10 MG Tablet 1 tablet Orally Once a day- as needed , Notes to Pharmacist: prn.     Not-Taking Valsartan 160 MG Tablet 1 tablet Orally Once a day. Tranexamic Acid 650 MG Tablet take 2 tabs three times a day on days 1-5 of menses Orally as directed. Provera(medroxyPROGESTERone Acetate) 10 MG Tablet 2 tablet with food Orally Once a day. Medication List reviewed and reconciled with the patient.    Medical History: hyperhydrosis allergic rhinitis migraine headaches/tension headaches Hx of gestational diabetes hypertension vitamin D deficiency seasonal allergies glucose intolerance, A1c 6.2 1/17 chronic constipation right torn [facility], Dr BAssett h/o palpitations, evaluated by cardiologist 2009, SVT on event monitor 11/17, Dr. Tilley tachycardia    Allergies/Intolerance: Amlodipine - hair loss Venofer - anaphylaxis    Gyn History: Sexual activity currently sexually active.  Periods : regular, every 25-28 days.  LMP 03/21/2023- lasted 2 weeks.  Denies Birth control.  Last pap smear date 08/07/2022- neg.  Last mammogram date 12/27/2018-normal.  Denies Abnormal pap smear.  Denies STD.     OB History: Number of pregnancies  2.  Pregnancy # 1  live birth, C-section delivery, boy.  Pregnancy # 2    live birth, vaginal delivery, girl.     Surgical History: Bilateral foot surgery x 2 C-Section x1    Hospitalization: childbirth 00, 09    Family History: Father: alive Mother: alive Paternal Grand Father: unknown Paternal Grand Mother: unknown Maternal Grand Father: deceased, hypertension, stroke, hypercholesterolemia, diagnosed with Hypertension, CVA Maternal Grand Mother: deceased 53 yrs, died at 53 in sleep, CHF--born with a congenital heart problem, needed a heart transplant, diagnosed with CHF (congestive  heart failure) Brother 1: alive, prostate issues Brother2: alive Maternal uncle: deceased, enlarged heart, diabetes, passed away 2 brother(s) . 1 son(s) , 1 daughter(s) . Aunt - DM, grandmother - heart problems as a child, Grandfather - CVA, cousin stage 2 breast cancer at 28, diabetes in extended family No known family Hx of Colon Ca, Colon Polyps or Liver Disease.    Social History:      General Tobacco use:   cigarettes:  Never smoked, Tobacco history last updated  04/14/2023, Vaping  No.  EXPOSURE TO PASSIVE SMOKE:  no.   Alcohol:  yes occasionally. Caffeine:  yes tea,. Recreational drug use:  no.   Exercise:  3-5 x a week, walking, stretching, light yoga. Marital Status:  married Married Tim Yutzy, self-employed booking agent & DJ, teaching radio broadcasting at school. Children:  3 steps Korina 96 ([place]), kaia 98 ([place]), Justin 05, 1 hers Brien 2000, 1 both parents, Alaina 2009. EDUCATION:  College grad school--gradauated 2019. OCCUPATION:  employed [employer], 3rd grade. Religion:  [employer] in [place]. Seat belt use:  yes.      ROS:      CONSTITUTIONAL Chills  No.  Fatigue  , YES.  Fever  No.  Night sweats  No.  Recent travel outside [place]  No.  Sweats  No.  Weight change  No.       OPHTHALMOLOGY Blurring of vision  no.  Change in vision  no.  Double vision  no.       ENT Dizziness  no.  Nose bleeds  no.  Sore throat  no.  Teeth pain  no.       ALLERGY Hives  no.       CARDIOLOGY Chest pain  no.  High blood pressure  no.  Irregular heart beat  no.  Leg edema  no.  Palpitations  no.       RESPIRATORY Shortness of breath  , YES.  Cough  no.  Wheezing  no.       UROLOGY Pain with urination  no.  Urinary urgency  no.  Urinary frequency  no.  Urinary incontinence  no.  Difficulty urinating  No.  Blood in urine  No.       GASTROENTEROLOGY Abdominal pain  no.  Appetite change  , yes.  Bloating/belching  no.  Blood in stool or on toilet paper  no.  Change  in bowel movements  no.  Constipation  no.  Diarrhea  no.  Difficulty swallowing  no.  Nausea  no.       FEMALE REPRODUCTIVE Vulvar pain  no.  Vulvar rash  no.  Abnormal vaginal bleeding  no.  Breast pain  no.  Nipple discharge  no.  Pain with intercourse  no.  Pelvic pain  no.  Unusual vaginal discharge  no.  Vaginal itching  no.       MUSCULOSKELETAL Muscle aches  no.       NEUROLOGY Headache yes,  , YES.  Tingling/numbness  no.    Weakness yes,  , YES.       PSYCHOLOGY Depression  no.  Anxiety  no.  Nervousness  no.  Sleep disturbances  no.  Suicidal ideation  no .       ENDOCRINOLOGY Excessive thirst  no.  Excessive urination  no.  Hair loss  no.  Heat or cold intolerance  no.       HEMATOLOGY/LYMPH Abnormal bleeding  no.  Easy bruising  no.  Swollen glands  no.       DERMATOLOGY New/changing skin lesion  no.  Rash  no.  Sores  no.    Objective:    Vitals: Wt: 151.8, Wt change: 6.4 lbs, Ht: 65, BMI: 25.26, Pulse sitting: 99, BP sitting: 119/81.    Past Results:    Examination:      General Examination CONSTITUTIONAL: alert, oriented, NAD.  SKIN: moist, warm.  EYES: Conjunctiva clear.  LUNGS: good I:E efffort noted.  ABDOMEN: no guarding.  EXTREMITIES: normal range of motion.  NEUROLOGIC EXAM: alert and oriented x 3.  PSYCH: affect normal.     Physical Examination:   Assessment:    Assessment: Menorrhagia with irregular cycle - N92.1 (Primary) Fibroids - D21.9 Anemia - D64.9   Plan:    Treatment:     Menorrhagia with irregular cycle Imaging:US ECHO TRANSVAGINAL (Performed Date - 04/14/2023)     Notes: the endometrium is thin 3.9 mm on ultrasound no em biopsy is requred. pt is reassured. plan to continue provera 20 mg daily.. pt desires definitive therapy with robotic assisted laparoscopic hysterectomy which is scheduled for October 2024.     Fibroids     Notes: U/s reviewed. with patients fibroids are small and stable in size from previous ultrasound 06/13/2021      Anemia  Lab:HGB (Collection Date & Time - 04/14/2023 09:48 AM)      Notes: patient is symptomatic. she is called and advised blood transfusion is recommended given her hemoglobin of 5.8. d/w pt r/b/a of blood transfusion including r/o transfusion reaction/ hiv / hepatitis B annd C.. she voiced understanding and agrees to blood transfusion, of not she had an allergic reaction to IV venofer.    Procedures:      Venipuncture Venipuncture: Hall, Kimberly 04/14/2023 09:52:25 AM >, performed in right arm.     Immunizations:    Therapeutic Injections:    Diagnostic Imaging:    Lab Reports:  Lab:HGB (Collection Date & Time - 04/14/2023 09:48 AM)  Critical low                   Value Reference Range                  HGB - 5.8 LL 12.0-16.0 - g/dL   Notes: Humbert Morozov 04/15/2023 11:06:44 AM > your hemoglobin is very low. I recommend a blood transfusion     Procedure Orders:    Preventive Medicine:    Health Risk Assessment:    Care Plan:    Next Appointment: will call pt with surgery date and preop visit / admit to  for blood transfusion  

## 2023-04-15 NOTE — Plan of Care (Signed)
  Problem: Education: Goal: Knowledge of General Education information will improve Description: Including pain rating scale, medication(s)/side effects and non-pharmacologic comfort measures Outcome: Completed/Met   

## 2023-04-16 DIAGNOSIS — N939 Abnormal uterine and vaginal bleeding, unspecified: Secondary | ICD-10-CM | POA: Diagnosis not present

## 2023-04-16 DIAGNOSIS — D259 Leiomyoma of uterus, unspecified: Secondary | ICD-10-CM | POA: Diagnosis not present

## 2023-04-16 DIAGNOSIS — Z79899 Other long term (current) drug therapy: Secondary | ICD-10-CM | POA: Diagnosis not present

## 2023-04-16 DIAGNOSIS — I10 Essential (primary) hypertension: Secondary | ICD-10-CM | POA: Diagnosis not present

## 2023-04-16 DIAGNOSIS — D649 Anemia, unspecified: Secondary | ICD-10-CM | POA: Diagnosis not present

## 2023-04-16 LAB — TYPE AND SCREEN
ABO/RH(D): O POS
Antibody Screen: NEGATIVE
Unit division: 0

## 2023-04-16 LAB — BPAM RBC
Blood Product Expiration Date: 202407052359
Blood Product Expiration Date: 202407052359
ISSUE DATE / TIME: 202406052003
ISSUE DATE / TIME: 202406052225
Unit Type and Rh: 5100

## 2023-04-16 LAB — HEMOGLOBIN AND HEMATOCRIT, BLOOD
HCT: 25.5 % — ABNORMAL LOW (ref 36.0–46.0)
Hemoglobin: 8.2 g/dL — ABNORMAL LOW (ref 12.0–15.0)

## 2023-04-16 MED ORDER — MEDROXYPROGESTERONE ACETATE 10 MG PO TABS: 20.0000 mg | ORAL_TABLET | Freq: Every day | ORAL | 0 refills | Status: DC

## 2023-04-16 NOTE — Plan of Care (Signed)

## 2023-04-16 NOTE — Discharge Summary (Signed)
Physician Discharge Summary  Patient ID: Krystal Arnold MRN: [id number] DOB/AGE: 1971/03/28 52 y.o.  Admit date: 04/15/2023 Discharge date: 04/16/2023  Admission Diagnoses:Symptomatic anemia/ Abnormal Uterine Bleeding/ Fibroids  Discharge Diagnoses:  Principal Problem:   Symptomatic anemia   Discharged Condition: stable  Hospital Course: pt was admitted for blood transfusion due to symptomatic anemia. She had a hemoglobin of 5.6 and was sob with minimal exertion. She received 2 units of pRBC's. Her post transfusion hgb was 8.2. SOB resolved after transfusion.   Consults: None  Significant Diagnostic Studies: labs:  Recent Results (from the past 2160 hour(s))  Basic metabolic panel     Status: Abnormal   Collection Time: 03/26/23 12:17 PM  Result Value Ref Range   Sodium 136 135 - 145 mmol/L   Potassium 3.8 3.5 - 5.1 mmol/L   Chloride 106 98 - 111 mmol/L   CO2 19 (L) 22 - 32 mmol/L   Glucose, Bld 106 (H) 70 - 99 mg/dL    Comment: Glucose reference range applies only to samples taken after fasting for at least 8 hours.   BUN 11 6 - 20 mg/dL   Creatinine, Ser 8.41 0.44 - 1.00 mg/dL   Calcium 8.7 (L) 8.9 - 10.3 mg/dL   GFR, Estimated >32 >44 mL/min    Comment: (NOTE) Calculated using the CKD-EPI Creatinine Equation (2021)    Anion gap 11 5 - 15    Comment: Performed at [facility], [address] [place], [place] [address]  CBC with Differential     Status: Abnormal   Collection Time: 03/26/23 12:17 PM  Result Value Ref Range   WBC 7.8 4.0 - 10.5 K/uL   RBC 3.09 (L) 3.87 - 5.11 MIL/uL   Hemoglobin 7.4 (L) 12.0 - 15.0 g/dL   HCT 25.3 (L) 66.4 - 40.3 %   MCV 80.3 80.0 - 100.0 fL   MCH 23.9 (L) 26.0 - 34.0 pg   MCHC 29.8 (L) 30.0 - 36.0 g/dL   RDW 47.4 (H) 25.9 - 56.3 %   Platelets 327 150 - 400 K/uL   nRBC 0.0 0.0 - 0.2 %   Neutrophils Relative % 71 %   Neutro Abs 5.6 1.7 - 7.7 K/uL   Lymphocytes Relative 19 %   Lymphs Abs 1.5 0.7 - 4.0 K/uL   Monocytes Relative  8 %   Monocytes Absolute 0.6 0.1 - 1.0 K/uL   Eosinophils Relative 1 %   Eosinophils Absolute 0.0 0.0 - 0.5 K/uL   Basophils Relative 1 %   Basophils Absolute 0.1 0.0 - 0.1 K/uL   Immature Granulocytes 0 %   Abs Immature Granulocytes 0.03 0.00 - 0.07 K/uL    Comment: Performed at [facility], [address] [place], [place] [address]  Troponin I (High Sensitivity)     Status: None   Collection Time: 03/26/23 12:17 PM  Result Value Ref Range   Troponin I (High Sensitivity) 4 <18 ng/L    Comment: (NOTE) Elevated high sensitivity troponin I (hsTnI) values and significant  changes across serial measurements may suggest ACS but many other  chronic and acute conditions are known to elevate hsTnI results.  Refer to the "Links" section for chest pain algorithms and additional  guidance. Performed at [facility], [address], [place], [place] [address]   ABO/Rh     Status: None   Collection Time: 03/26/23 12:17 PM  Result Value Ref Range   ABO/RH(D)  O POS Performed at [facility], [address], [place], [place] [address]   Type and screen [facility]     Status: None   Collection Time: 03/26/23  5:30 PM  Result Value Ref Range   ABO/RH(D) O POS    Antibody Screen NEG    Sample Expiration      03/29/2023,2359 Performed at [facility], [address], [place], [place] [address]   Hemoglobin and hematocrit, blood     Status: Abnormal   Collection Time: 03/26/23  5:43 PM  Result Value Ref Range   Hemoglobin 7.5 (L) 12.0 - 15.0 g/dL   HCT 30.8 (L) 65.7 - 84.6 %    Comment: Performed at [facility], [address] [place], [place] [address]  Vitamin B12     Status: None   Collection Time: 03/26/23  5:43 PM  Result Value Ref Range   Vitamin B-12 670 180 - 914 pg/mL    Comment: (NOTE) This assay is not validated for testing neonatal or myeloproliferative syndrome specimens for Vitamin B12 levels. Performed at [facility], [address] [place], [place] [address]   Folate     Status: None   Collection Time: 03/26/23  5:43 PM  Result Value Ref Range   Folate 29.8 >5.9 ng/mL    Comment: Performed at [facility], [address] [place], [place] [address]  Iron and TIBC     Status: Abnormal   Collection Time: 03/26/23  5:43 PM  Result Value Ref Range   Iron 10 (L) 28 - 170 ug/dL   TIBC 027 253 - 664 ug/dL   Saturation Ratios 2 (L) 10.4 - 31.8 %   UIBC 420 ug/dL    Comment: Performed at [facility], [address], [place], [place] [address]  Ferritin     Status: Abnormal   Collection Time: 03/26/23  5:43 PM  Result Value Ref Range   Ferritin 5 (L) 11 - 307 ng/mL    Comment: Performed at [facility], [address] [place], [place] [address]  Reticulocytes     Status: Abnormal   Collection Time: 03/26/23  5:43 PM  Result Value Ref Range   Retic Ct Pct 1.7 0.4 - 3.1 %   RBC. 3.15 (L) 3.87 - 5.11 MIL/uL   Retic Count, Absolute 54.2 19.0 - 186.0 K/uL   Immature Retic Fract 27.7 (H) 2.3 - 15.9 %    Comment: Performed at [facility], [address] [place], [place] [address]  Type and screen     Status: None   Collection Time: 04/15/23  6:30 PM  Result Value Ref Range   ABO/RH(D) O POS    Antibody Screen NEG    Sample Expiration 04/18/2023,2359    Unit Number [id number]    Blood Component Type RED CELLS,LR    Unit division 00    Status of Unit ISSUED,FINAL    Transfusion Status OK TO TRANSFUSE    Crossmatch Result Compatible    Unit Number [id number]    Blood Component Type RED CELLS,LR    Unit division 00    Status of Unit ISSUED,FINAL    Transfusion Status OK TO TRANSFUSE    Crossmatch Result      Compatible Performed at [facility], [address] [place], [place] [address]   BPAM RBC     Status: None   Collection Time:  04/15/23  6:30 PM  Result Value Ref Range   ISSUE DATE / TIME 528413244010    Blood Product Unit Number  [id number]    PRODUCT CODE [id number]    Unit Type and Rh 5100    Blood Product Expiration Date 756433295188    ISSUE DATE / TIME 416606301601    Blood Product Unit Number [id number]    PRODUCT CODE [id number]    Unit Type and Rh 5100    Blood Product Expiration Date [id number]   Prepare RBC (crossmatch)     Status: None   Collection Time: 04/15/23  6:32 PM  Result Value Ref Range   Order Confirmation      ORDER PROCESSED BY BLOOD BANK Performed at [facility], [address] [place], [place] [address]   CBC     Status: Abnormal   Collection Time: 04/15/23  6:32 PM  Result Value Ref Range   WBC 4.7 4.0 - 10.5 K/uL   RBC 2.48 (L) 3.87 - 5.11 MIL/uL   Hemoglobin 5.6 (LL) 12.0 - 15.0 g/dL    Comment: REPEATED TO VERIFY Reticulocyte Hemoglobin testing may be clinically indicated, consider ordering this additional test [id number] THIS CRITICAL RESULT HAS VERIFIED AND BEEN CALLED TO Beatriz Stallion, RN BY DAISY BLU ON 06 05 2024 AT 1858, AND HAS BEEN READ BACK.     HCT 18.9 (L) 36.0 - 46.0 %   MCV 76.2 (L) 80.0 - 100.0 fL   MCH 22.6 (L) 26.0 - 34.0 pg   MCHC 29.6 (L) 30.0 - 36.0 g/dL   RDW 46.2 (H) 70.3 - 50.0 %   Platelets 326 150 - 400 K/uL    Comment: REPEATED TO VERIFY   nRBC 0.0 0.0 - 0.2 %    Comment: Performed at [facility], [address], [place], [place] [address]  Hemoglobin and hematocrit, blood     Status: Abnormal   Collection Time: 04/16/23  4:54 AM  Result Value Ref Range   Hemoglobin 8.2 (L) 12.0 - 15.0 g/dL    Comment: REPEATED TO VERIFY POST TRANSFUSION SPECIMEN    HCT 25.5 (L) 36.0 - 46.0 %    Comment: Performed at [facility], [address] [place], [place] [address]     Treatments: Blood transfusion 2 packs RBC's  Discharge Exam: Blood pressure 125/88, pulse 80, temperature 97.9 F (36.6 C), temperature source Oral, resp. rate 18, height 5\' 5"  (1.651 m), weight 66.7 kg, SpO2 99 %. General appearance: alert, cooperative, and no  distress Resp: clear to auscultation bilaterally GI: soft, non-tender; bowel sounds normal; no masses,  no organomegaly Extremities: extremities normal, atraumatic, no cyanosis or edema  Disposition: Discharge disposition: 01-Home or Self Care       Discharge Instructions     Activity as tolerated - No restrictions   Complete by: As directed    Call MD for:  extreme fatigue   Complete by: As directed    Call MD for:  persistant dizziness or light-headedness   Complete by: As directed    Call MD for:  temperature >100.4   Complete by: As directed    Diet general   Complete by: As directed       Allergies as of 04/16/2023   No Known Allergies      Medication List     STOP taking these medications    Mirena (52 MG) 20 MCG/24HR IUD Generic drug: levonorgestrel       TAKE these medications  amLODipine 5 MG tablet Commonly known as: NORVASC Take 5 mg by mouth daily.   famotidine 20 MG tablet Commonly known as: Pepcid Take 1 tablet (20 mg total) by mouth 2 (two) times daily.   FeroSul 325 (65 FE) MG tablet Generic drug: ferrous sulfate Take 325 mg by mouth daily.   losartan-hydrochlorothiazide 100-12.5 MG tablet Commonly known as: HYZAAR Take 1 tablet by mouth daily.   medroxyPROGESTERone 10 MG tablet Commonly known as: Provera Take 2 tablets (20 mg total) by mouth daily.   metoprolol succinate 100 MG 24 hr tablet Commonly known as: TOPROL-XL Take 100 mg by mouth daily. Take with or immediately following a meal.   potassium chloride 10 MEQ tablet Commonly known as: KLOR-CON Take 10 mEq by mouth daily.   predniSONE 20 MG tablet Commonly known as: DELTASONE 2 tabs po daily x 3 days        Follow-up Information     Gerald Leitz, MD. Call.   Specialty: Obstetrics and Gynecology Why: If symptoms worsen.. Office will call you with preop visit prior to your scheduled  surgery Contact information: [address] [place]  [address] [phone]                 Signed: Gerald Leitz 04/16/2023, 10:06 AM

## 2023-04-16 NOTE — Progress Notes (Signed)
   04/16/23 1146  Departure Condition  Departure Condition Good  Mobility at [facility]  Patient/Caregiver Teaching Teach Back Method Used;Discharge instructions reviewed;Prescriptions reviewed;Follow-up care reviewed;Pain management discussed;Medications discussed;Patient/caregiver verbalized understanding  Departure Mode By self  Was procedural sedation performed on this patient during this visit? No   Patient alert and oriented x4, ambulatory, VS and pain stable.

## 2023-05-19 DIAGNOSIS — K5909 Other constipation: Secondary | ICD-10-CM | POA: Diagnosis not present

## 2023-05-19 DIAGNOSIS — I10 Essential (primary) hypertension: Secondary | ICD-10-CM | POA: Diagnosis not present

## 2023-05-19 DIAGNOSIS — D509 Iron deficiency anemia, unspecified: Secondary | ICD-10-CM | POA: Diagnosis not present

## 2023-05-19 DIAGNOSIS — I471 Supraventricular tachycardia, unspecified: Secondary | ICD-10-CM | POA: Diagnosis not present

## 2023-06-15 ENCOUNTER — Inpatient Hospital Stay: Payer: BC Managed Care – PPO

## 2023-06-15 ENCOUNTER — Other Ambulatory Visit: Payer: Self-pay

## 2023-06-15 ENCOUNTER — Inpatient Hospital Stay: Payer: BC Managed Care – PPO | Attending: Hematology and Oncology | Admitting: Hematology and Oncology

## 2023-06-15 VITALS — BP 155/97 | HR 118 | Temp 97.1°F | Resp 18 | Ht 65.0 in | Wt 145.2 lb

## 2023-06-15 DIAGNOSIS — E559 Vitamin D deficiency, unspecified: Secondary | ICD-10-CM | POA: Insufficient documentation

## 2023-06-15 DIAGNOSIS — D509 Iron deficiency anemia, unspecified: Secondary | ICD-10-CM | POA: Diagnosis not present

## 2023-06-16 ENCOUNTER — Encounter: Payer: Self-pay | Admitting: Hematology and Oncology

## 2023-06-16 DIAGNOSIS — E559 Vitamin D deficiency, unspecified: Secondary | ICD-10-CM | POA: Insufficient documentation

## 2023-06-16 NOTE — Assessment & Plan Note (Signed)
She has fifth diffuse muscle aches and pain I suspect she might have vitamin D deficiency I will check vitamin D level in her next visit

## 2023-06-16 NOTE — Progress Notes (Signed)
[facility] CONSULT NOTE  Patient Care Team: Laurann Montana, MD as PCP - General (Family Medicine)  ASSESSMENT & PLAN:  Iron deficiency anemia, unspecified The most likely cause of her anemia is due to chronic blood loss/malabsorption syndrome. We discussed some of the risks, benefits, and alternatives of intravenous iron infusions. The patient is symptomatic from anemia and the iron level is critically low. She tolerated oral iron supplement poorly and desires to achieved higher levels of iron faster for adequate hematopoesis. Some of the side-effects to be expected including risks of infusion reactions, phlebitis, headaches, nausea and fatigue.  The patient is willing to proceed. Patient education material was dispensed.  Goal is to keep ferritin level greater than 50 and resolution of anemia Due to history of side effects with IV iron sucrose, I will switch her to IV Feraheme with additional premedications I recommend minimum 2 cycles of treatment and reassess in 3 months  Vitamin D deficiency She has fifth diffuse muscle aches and pain I suspect she might have vitamin D deficiency I will check vitamin D level in her next visit Orders Placed This Encounter  Procedures   CBC with Differential (Cancer Center Only)    Standing Status:   Future    Standing Expiration Date:   06/15/2024   Ferritin    Standing Status:   Future    Standing Expiration Date:   06/15/2024   Vitamin B12    Standing Status:   Future    Standing Expiration Date:   06/15/2024   Sedimentation rate    Standing Status:   Future    Standing Expiration Date:   06/15/2024   Iron and Iron Binding Capacity ([facility] only)    Standing Status:   Future    Standing Expiration Date:   06/15/2024   VITAMIN D 25 Hydroxy (Vit-D Deficiency, Fractures)    Standing Status:   Future    Standing Expiration Date:   06/15/2024   ABO/Rh    Standing Status:   Future    Standing Expiration Date:   06/15/2024    All questions  were answered. The patient knows to call the clinic with any problems, questions or concerns.  The total time spent in the appointment was 60 minutes encounter with patients including review of chart and various tests results, discussions about plan of care and coordination of care plan  Artis Delay, MD 8/6/20248:03 AM   CHIEF COMPLAINTS/PURPOSE OF CONSULTATION:  Anemia  HISTORY OF PRESENTING ILLNESS:  Krystal Arnold 52 y.o. female is here because of anemia  She was found to have abnormal CBC from blood work monitoring She has been anemic for a long time due to heavy menstruation Last year around October, she was given intravenous iron sucrose complicated by allergic reaction On review of her blood work dated back to November 2008, hemoglobin has been at around 10.5 Most recently, on Mar 26, 2023, she went to the emergency department due to fatigue and heavy menstruation and was found to have hemoglobin of 7.4 On April 15, 2023, she received blood transfusion with hemoglobin of 5.6.  She had pelvic ultrasound which revealed presence of 3 uterine fibroids.  She is scheduled for possibility of hysterectomy but that was canceled as the patient was not available due to family obligations. She denies recent chest pain on exertion, shortness of breath on minimal exertion, pre-syncopal episodes, or palpitations currently but she has felt lightheaded and significant shortness of breath when she needed blood transfusion  in June  She had not noticed any recent bleeding such as epistaxis, hematuria or hemoptysis.  She has intermittent hematochezia at least once a week which she associated with chronic constipation.  She was started on Linzess with success.  She is scheduled to see [facility] physician for colonoscopy in the near future. The patient has intermittent over the counter NSAID ingestion. She is not on antiplatelets agents.  She had no prior history or diagnosis of cancer. Her age appropriate  screening programs are up-to-date. She has pica with craving but  eats a variety of diet. She never donated blood  The patient was prescribed oral iron supplements and she takes oral iron supplement every morning in the past 3 months with her protein shakes.  She complained of intermittent muscle cramps.  MEDICAL HISTORY:  Past Medical History:  Diagnosis Date   Allergic rhinitis    Hx of gestational diabetes mellitus, not currently pregnant    Hyperhydrosis disorder    Hypertension    after pregnancy   Migraine    SVT (supraventricular tachycardia)    Vitamin D deficiency     SURGICAL HISTORY: Past Surgical History:  Procedure Laterality Date   CESAREAN SECTION     FOOT SURGERY     WISDOM TOOTH EXTRACTION      SOCIAL HISTORY: Social History   Socioeconomic History   Marital status: Married    Spouse name: Dublin Highsmith    Number of children: Not on file   Years of education: Not on file   Highest education level: Not on file  Occupational History    Employer: [employer]  Tobacco Use   Smoking status: Never   Smokeless tobacco: Never  Vaping Use   Vaping status: Never Used  Substance and Sexual Activity   Alcohol use: Yes    Comment: occ   Drug use: No   Sexual activity: Yes    Comment: Mirena   Other Topics Concern   Not on file  Social History Narrative   Not on file   Social Determinants of Health   Financial Resource Strain: Not on file  Food Insecurity: No Food Insecurity (04/15/2023)   Hunger Vital Sign    Worried About Running Out of Food in the Last Year: Never true    Ran Out of Food in the Last Year: Never true  Transportation Needs: No Transportation Needs (04/15/2023)   PRAPARE - Administrator, Civil Service (Medical): No    Lack of Transportation (Non-Medical): No  Physical Activity: Not on file  Stress: Not on file  Social Connections: Not on file  Intimate Partner Violence: Not At Risk (04/15/2023)   Humiliation, Afraid, Rape, and Kick  questionnaire    Fear of Current or Ex-Partner: No    Emotionally Abused: No    Physically Abused: No    Sexually Abused: No    FAMILY HISTORY: Family History  Problem Relation Age of Onset   Heart murmur Mother    Heart disease Maternal Grandmother    Hypertension Maternal Grandfather    Breast cancer Maternal Aunt    Breast cancer Cousin     ALLERGIES:  is allergic to iron sucrose.  MEDICATIONS:  Current Outpatient Medications  Medication Sig Dispense Refill   FEROSUL 325 (65 Fe) MG tablet Take 325 mg by mouth daily.     medroxyPROGESTERone (PROVERA) 10 MG tablet Take 2 tablets (20 mg total) by mouth daily. 60 tablet 0   No current facility-administered medications for this  visit.    REVIEW OF SYSTEMS:   Constitutional: Denies fevers, chills or abnormal night sweats Eyes: Denies blurriness of vision, double vision or watery eyes Ears, nose, mouth, throat, and face: Denies mucositis or sore throat Cardiovascular: Denies palpitation, chest discomfort or lower extremity swelling Gastrointestinal:  Denies nausea, heartburn or change in bowel habits Skin: Denies abnormal skin rashes Lymphatics: Denies new lymphadenopathy or easy bruising Neurological:Denies numbness, tingling or new weaknesses Behavioral/Psych: Mood is stable, no new changes  All other systems were reviewed with the patient and are negative.  PHYSICAL EXAMINATION: ECOG PERFORMANCE STATUS: 1 - Symptomatic but completely ambulatory  Vitals:   06/15/23 1431  BP: (!) 155/97  Pulse: (!) 118  Resp: 18  Temp: (!) 97.1 F (36.2 C)  SpO2: 100%   Filed Weights   06/15/23 1431  Weight: 145 lb 4 oz (65.9 kg)    GENERAL:alert, no distress and comfortable SKIN: skin color, texture, turgor are normal, no rashes or significant lesions EYES: normal, conjunctiva are pink and non-injected, sclera clear OROPHARYNX:no exudate, no erythema and lips, buccal mucosa, and tongue normal  NECK: supple, thyroid normal  size, non-tender, without nodularity LYMPH:  no palpable lymphadenopathy in the cervical, axillary or inguinal LUNGS: clear to auscultation and percussion with normal breathing effort HEART: regular rate & rhythm and no murmurs and no lower extremity edema ABDOMEN:abdomen soft, non-tender and normal bowel sounds Musculoskeletal:no cyanosis of digits and no clubbing  PSYCH: alert & oriented x 3 with fluent speech NEURO: no focal motor/sensory deficits

## 2023-06-16 NOTE — Assessment & Plan Note (Signed)
The most likely cause of her anemia is due to chronic blood loss/malabsorption syndrome. We discussed some of the risks, benefits, and alternatives of intravenous iron infusions. The patient is symptomatic from anemia and the iron level is critically low. She tolerated oral iron supplement poorly and desires to achieved higher levels of iron faster for adequate hematopoesis. Some of the side-effects to be expected including risks of infusion reactions, phlebitis, headaches, nausea and fatigue.  The patient is willing to proceed. Patient education material was dispensed.  Goal is to keep ferritin level greater than 50 and resolution of anemia Due to history of side effects with IV iron sucrose, I will switch her to IV Feraheme with additional premedications I recommend minimum 2 cycles of treatment and reassess in 3 months

## 2023-06-17 ENCOUNTER — Telehealth: Payer: Self-pay | Admitting: Hematology and Oncology

## 2023-06-17 NOTE — Telephone Encounter (Signed)
Patient needed appointment reschedule for another date; Left patient a message with some times and dates available for rescheduling appointment

## 2023-06-23 ENCOUNTER — Telehealth: Payer: Self-pay | Admitting: Hematology and Oncology

## 2023-06-23 NOTE — Telephone Encounter (Signed)
Left a message in regards to appointment changes; left callback number to confirm appointment changes

## 2023-07-11 ENCOUNTER — Ambulatory Visit: Payer: BC Managed Care – PPO

## 2023-07-16 DIAGNOSIS — D649 Anemia, unspecified: Secondary | ICD-10-CM | POA: Diagnosis not present

## 2023-07-16 DIAGNOSIS — R634 Abnormal weight loss: Secondary | ICD-10-CM | POA: Diagnosis not present

## 2023-07-16 DIAGNOSIS — K625 Hemorrhage of anus and rectum: Secondary | ICD-10-CM | POA: Diagnosis not present

## 2023-07-16 DIAGNOSIS — K5900 Constipation, unspecified: Secondary | ICD-10-CM | POA: Diagnosis not present

## 2023-07-18 ENCOUNTER — Ambulatory Visit: Payer: BC Managed Care – PPO

## 2023-07-24 ENCOUNTER — Inpatient Hospital Stay: Payer: BC Managed Care – PPO | Attending: Hematology and Oncology

## 2023-07-24 VITALS — BP 124/89 | HR 90 | Temp 98.1°F | Resp 18

## 2023-07-24 DIAGNOSIS — D509 Iron deficiency anemia, unspecified: Secondary | ICD-10-CM | POA: Diagnosis not present

## 2023-07-24 MED ORDER — FAMOTIDINE IN NACL 20-0.9 MG/50ML-% IV SOLN
20.0000 mg | Freq: Once | INTRAVENOUS | Status: AC
  Administered 2023-07-24: 20 mg via INTRAVENOUS
  Filled 2023-07-24: qty 50

## 2023-07-24 MED ORDER — SODIUM CHLORIDE 0.9 % IV SOLN: INTRAVENOUS | Status: DC

## 2023-07-24 MED ORDER — LORATADINE 10 MG PO TABS
10.0000 mg | ORAL_TABLET | Freq: Once | ORAL | Status: AC
  Administered 2023-07-24: 10 mg via ORAL
  Filled 2023-07-24: qty 1

## 2023-07-24 MED ORDER — METHYLPREDNISOLONE SODIUM SUCC 125 MG IJ SOLR
125.0000 mg | Freq: Once | INTRAMUSCULAR | Status: AC
  Administered 2023-07-24: 125 mg via INTRAVENOUS
  Filled 2023-07-24: qty 2

## 2023-07-24 MED ORDER — SODIUM CHLORIDE 0.9 % IV SOLN
510.0000 mg | Freq: Once | INTRAVENOUS | Status: AC
  Administered 2023-07-24: 510 mg via INTRAVENOUS
  Filled 2023-07-24: qty 510

## 2023-07-24 NOTE — Progress Notes (Signed)
Pt observed for post feraheme infusion. Pt tolerated tx wellw/out complaints. VSS at d/c.

## 2023-07-31 MED FILL — Ferumoxytol Inj 510 MG/17ML (30 MG/ML) (Elemental Fe): INTRAVENOUS | Qty: 17 | Status: AC

## 2023-08-01 ENCOUNTER — Other Ambulatory Visit: Payer: Self-pay

## 2023-08-01 ENCOUNTER — Inpatient Hospital Stay: Payer: BC Managed Care – PPO

## 2023-08-01 VITALS — BP 134/97 | HR 83 | Temp 98.5°F | Resp 16

## 2023-08-01 DIAGNOSIS — D509 Iron deficiency anemia, unspecified: Secondary | ICD-10-CM | POA: Diagnosis not present

## 2023-08-01 MED ORDER — SODIUM CHLORIDE 0.9 % IV SOLN
510.0000 mg | Freq: Once | INTRAVENOUS | Status: AC
  Administered 2023-08-01: 510 mg via INTRAVENOUS
  Filled 2023-08-01: qty 510

## 2023-08-01 MED ORDER — METHYLPREDNISOLONE SODIUM SUCC 125 MG IJ SOLR
125.0000 mg | Freq: Every day | INTRAMUSCULAR | Status: DC
  Administered 2023-08-01: 125 mg via INTRAVENOUS
  Filled 2023-08-01: qty 2

## 2023-08-01 MED ORDER — FAMOTIDINE IN NACL 20-0.9 MG/50ML-% IV SOLN
20.0000 mg | Freq: Every day | INTRAVENOUS | Status: DC
  Administered 2023-08-01: 20 mg via INTRAVENOUS
  Filled 2023-08-01: qty 50

## 2023-08-01 MED ORDER — LORATADINE 10 MG PO TABS
10.0000 mg | ORAL_TABLET | Freq: Every day | ORAL | Status: DC
  Administered 2023-08-01: 10 mg via ORAL
  Filled 2023-08-01: qty 1

## 2023-08-01 MED ORDER — SODIUM CHLORIDE 0.9 % IV SOLN: Freq: Once | INTRAVENOUS | Status: AC

## 2023-08-01 NOTE — Patient Instructions (Signed)
 Ferumoxytol Injection What is this medication? FERUMOXYTOL (FER ue MOX i tol) treats low levels of iron in your body (iron deficiency anemia). Iron is a mineral that plays an important role in making red blood cells, which carry oxygen from your lungs to the rest of your body. This medicine may be used for other purposes; ask your health care provider or pharmacist if you have questions. COMMON BRAND NAME(S): Feraheme What should I tell my care team before I take this medication? They need to know if you have any of these conditions: Anemia not caused by low iron levels High levels of iron in the blood Magnetic resonance imaging (MRI) test scheduled An unusual or allergic reaction to iron, other medications, foods, dyes, or preservatives Pregnant or trying to get pregnant Breastfeeding How should I use this medication? This medication is injected into a vein. It is given by your care team in a hospital or clinic setting. Talk to your care team the use of this medication in children. Special care may be needed. Overdosage: If you think you have taken too much of this medicine contact a poison control center or emergency room at once. NOTE: This medicine is only for you. Do not share this medicine with others. What if I miss a dose? It is important not to miss your dose. Call your care team if you are unable to keep an appointment. What may interact with this medication? Other iron products This list may not describe all possible interactions. Give your health care provider a list of all the medicines, herbs, non-prescription drugs, or dietary supplements you use. Also tell them if you smoke, drink alcohol, or use illegal drugs. Some items may interact with your medicine. What should I watch for while using this medication? Visit your care team regularly. Tell your care team if your symptoms do not start to get better or if they get worse. You may need blood work done while you are taking this  medication. You may need to follow a special diet. Talk to your care team. Foods that contain iron include: whole grains/cereals, dried fruits, beans, or peas, leafy green vegetables, and organ meats (liver, kidney). What side effects may I notice from receiving this medication? Side effects that you should report to your care team as soon as possible: Allergic reactions--skin rash, itching, hives, swelling of the face, lips, tongue, or throat Low blood pressure--dizziness, feeling faint or lightheaded, blurry vision Shortness of breath Side effects that usually do not require medical attention (report to your care team if they continue or are bothersome): Flushing Headache Joint pain Muscle pain Nausea Pain, redness, or irritation at injection site This list may not describe all possible side effects. Call your doctor for medical advice about side effects. You may report side effects to FDA at 1-800-FDA-1088. Where should I keep my medication? This medication is given in a hospital or clinic. It will not be stored at home. NOTE: This sheet is a summary. It may not cover all possible information. If you have questions about this medicine, talk to your doctor, pharmacist, or health care provider.  2024 Elsevier/Gold Standard (2023-04-03 00:00:00)

## 2023-08-25 ENCOUNTER — Encounter (HOSPITAL_BASED_OUTPATIENT_CLINIC_OR_DEPARTMENT_OTHER): Payer: Self-pay

## 2023-08-25 ENCOUNTER — Ambulatory Visit (HOSPITAL_BASED_OUTPATIENT_CLINIC_OR_DEPARTMENT_OTHER): Admit: 2023-08-25 | Payer: BC Managed Care – PPO | Admitting: Obstetrics and Gynecology

## 2023-08-25 SURGERY — XI ROBOTIC ASSISTED LAPAROSCOPIC HYSTERECTOMY AND SALPINGECTOMY
Anesthesia: Choice | Laterality: Bilateral

## 2023-09-04 ENCOUNTER — Other Ambulatory Visit: Payer: Self-pay | Admitting: Nurse Practitioner

## 2023-09-04 DIAGNOSIS — K921 Melena: Secondary | ICD-10-CM | POA: Diagnosis not present

## 2023-09-04 DIAGNOSIS — K2950 Unspecified chronic gastritis without bleeding: Secondary | ICD-10-CM | POA: Diagnosis not present

## 2023-09-04 DIAGNOSIS — R634 Abnormal weight loss: Secondary | ICD-10-CM

## 2023-09-04 DIAGNOSIS — K625 Hemorrhage of anus and rectum: Secondary | ICD-10-CM | POA: Diagnosis not present

## 2023-09-04 DIAGNOSIS — K2930 Chronic superficial gastritis without bleeding: Secondary | ICD-10-CM | POA: Diagnosis not present

## 2023-09-18 ENCOUNTER — Encounter: Payer: Self-pay | Admitting: Hematology and Oncology

## 2023-09-25 ENCOUNTER — Inpatient Hospital Stay: Payer: Self-pay | Admitting: Hematology and Oncology

## 2023-09-25 ENCOUNTER — Inpatient Hospital Stay: Payer: Self-pay | Attending: Hematology and Oncology

## 2023-09-25 ENCOUNTER — Encounter: Payer: Self-pay | Admitting: Hematology and Oncology

## 2024-05-20 ENCOUNTER — Telehealth: Payer: Self-pay

## 2024-05-20 NOTE — Telephone Encounter (Signed)
 Patient called to schedule lab and follow-up appointment with Dr. Lonn in response to message received by patient's PCP about recent lab results. Patient's recent hemoglobin was found to be 7.4 and ferritin level of 4.  Patient called and confirmed appointments for Monday, 7/14 for lab work and visit with Dr. Lonn.  Strict ED precautions reviewed with patient.  Patient affirms some dyspnea with ambulating up the stairs. She denies any dizziness or blurred vision at this time. No dark or tarry stools noted at this time, however patient has noted some blood in her stool previously which she attributed to straining. Dr. Lonn made aware of the information provided by patient.

## 2024-05-23 ENCOUNTER — Encounter: Payer: Self-pay | Admitting: Hematology and Oncology

## 2024-05-23 ENCOUNTER — Inpatient Hospital Stay: Attending: Hematology and Oncology | Admitting: Hematology and Oncology

## 2024-05-23 ENCOUNTER — Inpatient Hospital Stay

## 2024-05-23 VITALS — BP 144/93 | HR 88 | Temp 99.4°F | Resp 18 | Ht 65.0 in | Wt 165.8 lb

## 2024-05-23 DIAGNOSIS — E559 Vitamin D deficiency, unspecified: Secondary | ICD-10-CM

## 2024-05-23 DIAGNOSIS — N926 Irregular menstruation, unspecified: Secondary | ICD-10-CM | POA: Insufficient documentation

## 2024-05-23 DIAGNOSIS — D509 Iron deficiency anemia, unspecified: Secondary | ICD-10-CM

## 2024-05-23 LAB — CBC WITH DIFFERENTIAL (CANCER CENTER ONLY)
Abs Immature Granulocytes: 0.01 K/uL (ref 0.00–0.07)
Basophils Absolute: 0.1 K/uL (ref 0.0–0.1)
Basophils Relative: 2 %
Eosinophils Absolute: 0.1 K/uL (ref 0.0–0.5)
Eosinophils Relative: 3 %
HCT: 27 % — ABNORMAL LOW (ref 36.0–46.0)
Hemoglobin: 8.1 g/dL — ABNORMAL LOW (ref 12.0–15.0)
Immature Granulocytes: 0 %
Lymphocytes Relative: 32 %
Lymphs Abs: 1.4 K/uL (ref 0.7–4.0)
MCH: 21.8 pg — ABNORMAL LOW (ref 26.0–34.0)
MCHC: 30 g/dL (ref 30.0–36.0)
MCV: 72.6 fL — ABNORMAL LOW (ref 80.0–100.0)
Monocytes Absolute: 0.5 K/uL (ref 0.1–1.0)
Monocytes Relative: 12 %
Neutro Abs: 2.3 K/uL (ref 1.7–7.7)
Neutrophils Relative %: 51 %
Platelet Count: 280 K/uL (ref 150–400)
RBC: 3.72 MIL/uL — ABNORMAL LOW (ref 3.87–5.11)
RDW: 19 % — ABNORMAL HIGH (ref 11.5–15.5)
WBC Count: 4.4 K/uL (ref 4.0–10.5)
nRBC: 0 % (ref 0.0–0.2)

## 2024-05-23 LAB — VITAMIN B12: Vitamin B-12: 2109 pg/mL — ABNORMAL HIGH (ref 180–914)

## 2024-05-23 LAB — IRON AND IRON BINDING CAPACITY (CC-WL,HP ONLY)
Iron: 21 ug/dL — ABNORMAL LOW (ref 28–170)
Saturation Ratios: 4 % — ABNORMAL LOW (ref 10.4–31.8)
TIBC: 588 ug/dL — ABNORMAL HIGH (ref 250–450)
UIBC: 567 ug/dL — ABNORMAL HIGH (ref 148–442)

## 2024-05-23 LAB — VITAMIN D 25 HYDROXY (VIT D DEFICIENCY, FRACTURES): Vit D, 25-Hydroxy: 10.37 ng/mL — ABNORMAL LOW (ref 30–100)

## 2024-05-23 LAB — FERRITIN: Ferritin: 7 ng/mL — ABNORMAL LOW (ref 11–307)

## 2024-05-23 LAB — ABO/RH: ABO/RH(D): O POS

## 2024-05-23 LAB — SEDIMENTATION RATE: Sed Rate: 15 mm/h (ref 0–22)

## 2024-05-23 NOTE — Assessment & Plan Note (Addendum)
 She has fifth diffuse muscle aches and pain She will continue high-dose vitamin D  replacement therapy

## 2024-05-23 NOTE — Assessment & Plan Note (Addendum)
 This is likely the source of her iron  deficiency anemia Observe closely for now

## 2024-05-23 NOTE — Assessment & Plan Note (Addendum)
The most likely cause of her anemia is due to chronic blood loss/malabsorption syndrome. We discussed some of the risks, benefits, and alternatives of intravenous iron infusions. The patient is symptomatic from anemia and the iron level is critically low. She tolerated oral iron supplement poorly and desires to achieved higher levels of iron faster for adequate hematopoesis. Some of the side-effects to be expected including risks of infusion reactions, phlebitis, headaches, nausea and fatigue.  The patient is willing to proceed. Patient education material was dispensed.  Goal is to keep ferritin level greater than 50 and resolution of anemia Due to history of side effects with IV iron sucrose, I will switch her to IV Feraheme with additional premedications I recommend minimum 2 cycles of treatment and reassess in 3 months

## 2024-05-23 NOTE — Progress Notes (Signed)
 [facility] OFFICE PROGRESS NOTE  Teresa Channel, MD  ASSESSMENT & PLAN:  Assessment & Plan Iron  deficiency anemia, unspecified iron  deficiency anemia type The most likely cause of her anemia is due to chronic blood loss/malabsorption syndrome. We discussed some of the risks, benefits, and alternatives of intravenous iron  infusions. The patient is symptomatic from anemia and the iron  level is critically low. She tolerated oral iron  supplement poorly and desires to achieved higher levels of iron  faster for adequate hematopoesis. Some of the side-effects to be expected including risks of infusion reactions, phlebitis, headaches, nausea and fatigue.  The patient is willing to proceed. Patient education material was dispensed.  Goal is to keep ferritin level greater than 50 and resolution of anemia Due to history of side effects with IV iron  sucrose, I will switch her to IV Feraheme  with additional premedications I recommend minimum 2 cycles of treatment and reassess in 3 months Vitamin D  deficiency She has fifth diffuse muscle aches and pain She will continue high-dose vitamin D  replacement therapy Irregular menstrual bleeding This is likely the source of her iron  deficiency anemia Observe closely for now    No orders of the defined types were placed in this encounter.   INTERVAL HISTORY: Patient returns for recurrent anemia I saw her last year, and then she was lost to follow-up She started to complain of excessive fatigue and was found to be iron  deficient She has recent heavy menstruation but start skipping 1 cycle recently Denies NSAID use Symptoms of anemia includes dyspnea and fatigue We reviewed CBC and iron  studies today  SUMMARY OF HEMATOLOGIC HISTORY:  She was found to have abnormal CBC from blood work monitoring She has been anemic for a long time due to heavy menstruation Last year around October, she was given intravenous iron  sucrose complicated by  allergic reaction On review of her blood work dated back to November 2008, hemoglobin has been at around 10.5 Most recently, on Mar 26, 2023, she went to the emergency department due to fatigue and heavy menstruation and was found to have hemoglobin of 7.4 On April 15, 2023, she received blood transfusion with hemoglobin of 5.6.  She had pelvic ultrasound which revealed presence of 3 uterine fibroids.  She is scheduled for possibility of hysterectomy but that was canceled as the patient was not available due to family obligations. She denies recent chest pain on exertion, shortness of breath on minimal exertion, pre-syncopal episodes, or palpitations currently but she has felt lightheaded and significant shortness of breath when she needed blood transfusion in June  She had not noticed any recent bleeding such as epistaxis, hematuria or hemoptysis.  She has intermittent hematochezia at least once a week which she associated with chronic constipation.  She was started on Linzess with success.  She is scheduled to see [facility] physician for colonoscopy in the near future. The patient has intermittent over the counter NSAID ingestion. She is not on antiplatelets agents.  She had no prior history or diagnosis of cancer. Her age appropriate screening programs are up-to-date. She has pica with craving but  eats a variety of diet. She never donated blood  The patient was prescribed oral iron  supplements and she takes oral iron  supplement every morning in the past 3 months with her protein shakes.  She complained of intermittent muscle cramps. Last intravenous iron  infusion was September 2024  Vitals:   05/23/24 0847  BP: (!) 144/93  Pulse: 88  Resp: 18  Temp: 99.4  F (37.4 C)  SpO2: 100%

## 2024-05-31 ENCOUNTER — Telehealth: Payer: Self-pay | Admitting: Pharmacy Technician

## 2024-05-31 NOTE — Telephone Encounter (Signed)
 Dr. Lonn,  Patient has been approved for feraheme  and will be scheduled as soon as possible.  Auth Submission: APPROVED Site of care: Site of care: [facility] Payer: [employer] Medication & CPT/J Code(s) submitted: Feraheme  (ferumoxytol ) [id number] Diagnosis Code:  Route of submission (phone, fax, portal):  Phone # Fax # Auth type: Buy/Bill PB Units/visits requested: 510MG  X2 DOSES Reference number: [id number] Approval from: 05/24/24 to 11/24/24

## 2024-06-02 ENCOUNTER — Ambulatory Visit

## 2024-06-02 VITALS — BP 128/88 | HR 85 | Temp 97.3°F | Resp 16 | Ht 64.0 in | Wt 167.4 lb

## 2024-06-02 DIAGNOSIS — D509 Iron deficiency anemia, unspecified: Secondary | ICD-10-CM | POA: Diagnosis not present

## 2024-06-02 MED ORDER — ACETAMINOPHEN 325 MG PO TABS
650.0000 mg | ORAL_TABLET | Freq: Once | ORAL | Status: AC
  Administered 2024-06-02: 650 mg via ORAL
  Filled 2024-06-02: qty 2

## 2024-06-02 MED ORDER — DIPHENHYDRAMINE HCL 25 MG PO CAPS
25.0000 mg | ORAL_CAPSULE | Freq: Once | ORAL | Status: AC
  Administered 2024-06-02: 25 mg via ORAL
  Filled 2024-06-02: qty 1

## 2024-06-02 MED ORDER — SODIUM CHLORIDE 0.9 % IV SOLN
510.0000 mg | Freq: Once | INTRAVENOUS | Status: AC
  Administered 2024-06-02: 510 mg via INTRAVENOUS
  Filled 2024-06-02: qty 17

## 2024-06-02 NOTE — Progress Notes (Signed)
 Diagnosis: Iron  Deficiency Anemia  Provider:  Praveen Mannam MD  Procedure: IV Infusion  IV Type: Peripheral, IV Location: R Antecubital  Feraheme  (Ferumoxytol ), Dose: 510 mg  Infusion Start Time: 1206  Infusion Stop Time: 1225  Post Infusion IV Care: Observation period completed  Discharge: Condition: Good, Destination: Home . AVS Provided  Performed by:  Eleanor DELENA Bloch, RN

## 2024-06-05 ENCOUNTER — Other Ambulatory Visit: Payer: Self-pay

## 2024-06-05 ENCOUNTER — Encounter: Payer: Self-pay | Admitting: *Deleted

## 2024-06-05 DIAGNOSIS — K0889 Other specified disorders of teeth and supporting structures: Secondary | ICD-10-CM | POA: Insufficient documentation

## 2024-06-05 NOTE — ED Triage Notes (Signed)
 Pt ambulatory to triage.  Pt has right upper tooth pain.  Pt states she cracked her tooth 1 week ago.  Pt states pain is worse tonight.  Pt taking otc meds without relief.  Pt alert.

## 2024-06-06 ENCOUNTER — Emergency Department
Admission: EM | Admit: 2024-06-06 | Discharge: 2024-06-06 | Disposition: A | Discharge status: Home / Self Care | Attending: Emergency Medicine | Admitting: Emergency Medicine

## 2024-06-06 DIAGNOSIS — K0889 Other specified disorders of teeth and supporting structures: Secondary | ICD-10-CM

## 2024-06-06 MED ORDER — OXYCODONE HCL 5 MG PO TABS
5.0000 mg | ORAL_TABLET | Freq: Once | ORAL | Status: AC
  Administered 2024-06-06: 5 mg via ORAL
  Filled 2024-06-06: qty 1

## 2024-06-06 MED ORDER — AMOXICILLIN-POT CLAVULANATE 875-125 MG PO TABS: 1.0000 | ORAL_TABLET | Freq: Two times a day (BID) | ORAL | 0 refills | Status: AC

## 2024-06-06 MED ORDER — OXYCODONE HCL 5 MG PO TABS: 5.0000 mg | ORAL_TABLET | Freq: Three times a day (TID) | ORAL | 0 refills | Status: DC | PRN

## 2024-06-06 MED ORDER — ACETAMINOPHEN 500 MG PO TABS
1000.0000 mg | ORAL_TABLET | Freq: Once | ORAL | Status: AC
  Administered 2024-06-06: 1000 mg via ORAL
  Filled 2024-06-06: qty 2

## 2024-06-06 MED ORDER — AMOXICILLIN-POT CLAVULANATE 875-125 MG PO TABS
1.0000 | ORAL_TABLET | Freq: Once | ORAL | Status: AC
  Administered 2024-06-06: 1 via ORAL
  Filled 2024-06-06: qty 1

## 2024-06-06 MED ORDER — IBUPROFEN 600 MG PO TABS
600.0000 mg | ORAL_TABLET | Freq: Once | ORAL | Status: AC
  Administered 2024-06-06: 600 mg via ORAL
  Filled 2024-06-06: qty 1

## 2024-06-06 NOTE — ED Provider Notes (Signed)
 [facility] Provider Note    Event Date/Time   First MD Initiated Contact with Patient 06/06/24 0012     (approximate)   History   Dental Pain   HPI  Krystal Arnold is a 53 y.o. female   Past medical history of no significant past medical history presents to Emergency Department with toothache.  Started couple days ago when she felt a small hole in the right upper molar.  No pain then but pain progressively worsening over the last 1 day.  No purulent drainage, no masses, able to swallow okay and breathe okay.  No systemic symptoms like fever.   External Medical Documents Reviewed: Outpatient doctor notes from July for iron  deficiency anemia      Physical Exam   Triage Vital Signs: ED Triage Vitals  Encounter Vitals Group     BP 06/05/24 2307 (!) 143/92     Girls Systolic BP Percentile --      Girls Diastolic BP Percentile --      Boys Systolic BP Percentile --      Boys Diastolic BP Percentile --      Pulse Rate 06/05/24 2307 84     Resp 06/05/24 2307 18     Temp 06/05/24 2307 98 F (36.7 C)     Temp Source 06/05/24 2307 Oral     SpO2 06/05/24 2307 100 %     Weight 06/05/24 2308 165 lb (74.8 kg)     Height 06/05/24 2308 5' 4 (1.626 m)     Head Circumference --      Peak Flow --      Pain Score 06/05/24 2308 9     Pain Loc --      Pain Education --      Exclude from Growth Chart --     Most recent vital signs: Vitals:   06/05/24 2307  BP: (!) 143/92  Pulse: 84  Resp: 18  Temp: 98 F (36.7 C)  SpO2: 100%    General: Awake, no distress.  CV:  Good peripheral perfusion.  Resp:  Normal effort.  Abd:  No distention.  Other:  Awake alert comfortable no dysarthria, normal vital signs afebrile neck supple full and motion no obvious abscess on intraoral examination.  No neck swelling or cervical adenopathy.   ED Results / Procedures / Treatments   Labs (all labs ordered are listed, but only abnormal results are displayed) Labs  Reviewed - No data to display    PROCEDURES:  Critical Care performed: No  Procedures   MEDICATIONS ORDERED IN ED: Medications  ibuprofen  (ADVIL ) tablet 600 mg (has no administration in time range)  acetaminophen  (TYLENOL ) tablet 1,000 mg (has no administration in time range)  oxyCODONE  (Oxy IR/ROXICODONE ) immediate release tablet 5 mg (has no administration in time range)  amoxicillin -clavulanate (AUGMENTIN ) 875-125 MG per tablet 1 tablet (has no administration in time range)   IMPRESSION / MDM / ASSESSMENT AND PLAN / ED COURSE  I reviewed the triage vital signs and the nursing notes.                                Patient's presentation is most consistent with acute complicated illness / injury requiring diagnostic workup.  Differential diagnosis includes, but is not limited to, cavity, dental infection, considered but less likely airway obstruction Ludwig sepsis    MDM:    Sebastian has the beginning of a  cavity and dental infection with no obvious signs of abscess that would be amenable to drainage at this time and appears comfortable and nontoxic fortunately.  No sign of airway obstruction.  Will give pain medications, she can follow-up with her dentist in the morning, and I will start her on antibiotic.       FINAL CLINICAL IMPRESSION(S) / ED DIAGNOSES   Final diagnoses:  Toothache     Rx / DC Orders   ED Discharge Orders          Ordered    oxyCODONE  (ROXICODONE ) 5 MG immediate release tablet  Every 8 hours PRN        06/06/24 0100    amoxicillin -clavulanate (AUGMENTIN ) 875-125 MG tablet  2 times daily        06/06/24 0100             Note:  This document was prepared using Dragon voice recognition software and may include unintentional dictation errors.    Cyrena Mylar, MD 06/06/24 [phone]

## 2024-06-06 NOTE — Discharge Instructions (Addendum)
 See your dentist in the morning for follow-up appointment. Take antibiotics as prescribed.   Take acetaminophen  650 mg and ibuprofen  400 mg every 6 hours for pain.  Take with food. Use oxycodone  for severe pain only.

## 2024-06-09 ENCOUNTER — Ambulatory Visit

## 2024-06-09 VITALS — BP 134/90 | HR 95 | Temp 99.2°F | Resp 16 | Ht 64.0 in | Wt 166.4 lb

## 2024-06-09 DIAGNOSIS — D509 Iron deficiency anemia, unspecified: Secondary | ICD-10-CM | POA: Diagnosis not present

## 2024-06-09 MED ORDER — DIPHENHYDRAMINE HCL 25 MG PO CAPS: 25.0000 mg | ORAL_CAPSULE | Freq: Once | ORAL | Status: DC

## 2024-06-09 MED ORDER — ACETAMINOPHEN 325 MG PO TABS: 650.0000 mg | ORAL_TABLET | Freq: Once | ORAL | Status: DC

## 2024-06-09 MED ORDER — SODIUM CHLORIDE 0.9 % IV SOLN
510.0000 mg | Freq: Once | INTRAVENOUS | Status: AC
  Administered 2024-06-09: 510 mg via INTRAVENOUS
  Filled 2024-06-09: qty 17

## 2024-06-09 NOTE — Progress Notes (Signed)
 Diagnosis: Iron  Deficiency Anemia  Provider:  Praveen Mannam MD  Procedure: IV Infusion  IV Type: Peripheral, IV Location: R Antecubital  Feraheme  (Ferumoxytol ), Dose: 510 mg  Infusion Start Time: 1155  Infusion Stop Time: 1213  Post Infusion IV Care: Patient declined observation and Peripheral IV Discontinued  Discharge: Condition: Good, Destination: Home . AVS Declined  Performed by:  Ashby Beery, RN    Patient refused pre-medications. Nurse educated patient and stressed the importance of taking pre-medications as a precaution in the event of a medication reaction. Patient verbalized understanding.

## 2024-06-28 ENCOUNTER — Other Ambulatory Visit: Payer: Self-pay | Admitting: Hematology and Oncology

## 2024-06-28 DIAGNOSIS — D509 Iron deficiency anemia, unspecified: Secondary | ICD-10-CM

## 2024-07-01 ENCOUNTER — Inpatient Hospital Stay: Attending: Hematology and Oncology

## 2024-07-01 ENCOUNTER — Inpatient Hospital Stay: Admitting: Hematology and Oncology

## 2024-07-01 ENCOUNTER — Encounter: Payer: Self-pay | Admitting: Hematology and Oncology

## 2024-07-01 VITALS — BP 134/85 | HR 88 | Temp 97.4°F | Resp 18 | Ht 64.0 in | Wt 166.8 lb

## 2024-07-01 DIAGNOSIS — N926 Irregular menstruation, unspecified: Secondary | ICD-10-CM | POA: Diagnosis not present

## 2024-07-01 DIAGNOSIS — D509 Iron deficiency anemia, unspecified: Secondary | ICD-10-CM | POA: Diagnosis present

## 2024-07-01 LAB — CBC WITH DIFFERENTIAL (CANCER CENTER ONLY)
Abs Immature Granulocytes: 0.01 K/uL (ref 0.00–0.07)
Basophils Absolute: 0.1 K/uL (ref 0.0–0.1)
Basophils Relative: 1 %
Eosinophils Absolute: 0.1 K/uL (ref 0.0–0.5)
Eosinophils Relative: 2 %
HCT: 36.8 % (ref 36.0–46.0)
Hemoglobin: 11.4 g/dL — ABNORMAL LOW (ref 12.0–15.0)
Immature Granulocytes: 0 %
Lymphocytes Relative: 24 %
Lymphs Abs: 1.1 K/uL (ref 0.7–4.0)
MCH: 26 pg (ref 26.0–34.0)
MCHC: 31 g/dL (ref 30.0–36.0)
MCV: 83.8 fL (ref 80.0–100.0)
Monocytes Absolute: 0.3 K/uL (ref 0.1–1.0)
Monocytes Relative: 8 %
Neutro Abs: 2.9 K/uL (ref 1.7–7.7)
Neutrophils Relative %: 65 %
Platelet Count: 393 K/uL (ref 150–400)
RBC: 4.39 MIL/uL (ref 3.87–5.11)
WBC Count: 4.5 K/uL (ref 4.0–10.5)
nRBC: 0 % (ref 0.0–0.2)

## 2024-07-01 LAB — IRON AND IRON BINDING CAPACITY (CC-WL,HP ONLY)
Iron: 85 ug/dL (ref 28–170)
Saturation Ratios: 22 % (ref 10.4–31.8)
TIBC: 381 ug/dL (ref 250–450)
UIBC: 296 ug/dL (ref 148–442)

## 2024-07-01 LAB — FERRITIN: Ferritin: 89 ng/mL (ref 11–307)

## 2024-07-01 NOTE — Assessment & Plan Note (Addendum)
 She denies infusion reactions with intravenous iron  Feraheme  Overall, she has improvement in energy level since her last infusion However, she is still having heavy menstrual cycle on the monthly basis Repeat labs today were reviewed, she has improvement of her anemia but not quite normal In 2023, her normal hemoglobin was over 14 g  The most likely cause of her anemia is due to chronic blood loss/malabsorption syndrome. We discussed some of the risks, benefits, and alternatives of intravenous iron  infusions. The patient is symptomatic from anemia and the iron  level is critically low. She tolerated oral iron  supplement poorly and desires to achieved higher levels of iron  faster for adequate hematopoesis. Some of the side-effects to be expected including risks of infusion reactions, phlebitis, headaches, nausea and fatigue.  The patient is willing to proceed. Patient education material was dispensed.  Goal is to keep ferritin level greater than 50 and resolution of anemia I recommend 2 more doses of intravenous iron  Feraheme  I plan to reassess in 3 months

## 2024-07-01 NOTE — Assessment & Plan Note (Addendum)
 This is likely the source of her iron  deficiency anemia Observe closely for now According to the patient, she is scheduling hysterectomy next year I will defer to her gynecologist for management

## 2024-07-01 NOTE — Progress Notes (Signed)
 [facility] OFFICE PROGRESS NOTE  Krystal Channel, MD  ASSESSMENT & PLAN:  Assessment & Plan Iron  deficiency anemia, unspecified iron  deficiency anemia type She denies infusion reactions with intravenous iron  Feraheme  Overall, she has improvement in energy level since her last infusion However, she is still having heavy menstrual cycle on the monthly basis Repeat labs today were reviewed, she has improvement of her anemia but not quite normal In 2023, her normal hemoglobin was over 14 g  The most likely cause of her anemia is due to chronic blood loss/malabsorption syndrome. We discussed some of the risks, benefits, and alternatives of intravenous iron  infusions. The patient is symptomatic from anemia and the iron  level is critically low. She tolerated oral iron  supplement poorly and desires to achieved higher levels of iron  faster for adequate hematopoesis. Some of the side-effects to be expected including risks of infusion reactions, phlebitis, headaches, nausea and fatigue.  The patient is willing to proceed. Patient education material was dispensed.  Goal is to keep ferritin level greater than 50 and resolution of anemia I recommend 2 more doses of intravenous iron  Feraheme  I plan to reassess in 3 months Irregular menstrual bleeding This is likely the source of her iron  deficiency anemia Observe closely for now According to the patient, she is scheduling hysterectomy next year I will defer to her gynecologist for management    Orders Placed This Encounter  Procedures   Ferritin    Standing Status:   Future    Expiration Date:   07/01/2025   Iron  and Iron  Binding Capacity ([facility] only)    Standing Status:   Future    Expiration Date:   07/01/2025   CBC with Differential (Cancer Center Only)    Standing Status:   Future    Expiration Date:   07/01/2025   Vitamin B12    Standing Status:   Future    Expiration Date:   07/01/2025   Reticulocytes    Standing  Status:   Future    Expiration Date:   07/01/2025    INTERVAL HISTORY: Patient returns for recurrent anemia Symptoms of anemia includes fatigue and pallor We reviewed CBC and iron  studies  SUMMARY OF HEMATOLOGIC HISTORY:  She was found to have abnormal CBC from blood work monitoring She has been anemic for a long time due to heavy menstruation Last year around October, she was given intravenous iron  sucrose complicated by allergic reaction On review of her blood work dated back to November 2008, hemoglobin has been at around 10.5 Most recently, on Mar 26, 2023, she went to the emergency department due to fatigue and heavy menstruation and was found to have hemoglobin of 7.4 On April 15, 2023, she received blood transfusion with hemoglobin of 5.6.  She had pelvic ultrasound which revealed presence of 3 uterine fibroids.  She is scheduled for possibility of hysterectomy but that was canceled as the patient was not available due to family obligations. She denies recent chest pain on exertion, shortness of breath on minimal exertion, pre-syncopal episodes, or palpitations currently but she has felt lightheaded and significant shortness of breath when she needed blood transfusion in June  She had not noticed any recent bleeding such as epistaxis, hematuria or hemoptysis.  She has intermittent hematochezia at least once a week which she associated with chronic constipation.  She was started on Linzess with success.  She is scheduled to see [facility] physician for colonoscopy in the near future. The patient has intermittent over  the counter NSAID ingestion. She is not on antiplatelets agents.  She had no prior history or diagnosis of cancer. Her age appropriate screening programs are up-to-date. She has pica with craving but  eats a variety of diet. She never donated blood  The patient was prescribed oral iron  supplements and she takes oral iron  supplement every morning in the past 3 months with her  protein shakes.  She complained of intermittent muscle cramps. Last intravenous iron  infusion was September 2024 She tolerated intravenous iron  Feraheme  well in July 2025  Lab Results  Component Value Date   VITAMINB12 2,109 (H) 05/23/2024   FERRITIN 7 (L) 05/23/2024   Vitals:   07/01/24 0842  BP: 134/85  Pulse: 88  Resp: 18  Temp: (!) 97.4 F (36.3 C)  SpO2: 100%

## 2024-07-05 ENCOUNTER — Telehealth: Payer: Self-pay | Admitting: Pharmacy Technician

## 2024-07-05 NOTE — Telephone Encounter (Signed)
 Dr. Lonn Patient will be scheduled as soon as possible.  Auth Submission: APPROVED Site of care: Site of care: [facility] Payer: [employer] Medication & CPT/J Code(s) submitted: Feraheme  (ferumoxytol ) [id number] Diagnosis Code:  Route of submission (phone, fax, portal):  Phone # Fax # Auth type: Buy/Bill PB Units/visits requested: 2 DOSES Reference number: [id number] Approval from: 07/01/24 to 01/01/25

## 2024-07-29 ENCOUNTER — Ambulatory Visit (INDEPENDENT_AMBULATORY_CARE_PROVIDER_SITE_OTHER)

## 2024-07-29 VITALS — BP 121/85 | HR 90 | Temp 98.4°F | Resp 20 | Ht 64.0 in | Wt 169.4 lb

## 2024-07-29 DIAGNOSIS — N926 Irregular menstruation, unspecified: Secondary | ICD-10-CM

## 2024-07-29 DIAGNOSIS — D509 Iron deficiency anemia, unspecified: Secondary | ICD-10-CM | POA: Diagnosis not present

## 2024-07-29 DIAGNOSIS — N920 Excessive and frequent menstruation with regular cycle: Secondary | ICD-10-CM | POA: Diagnosis not present

## 2024-07-29 MED ORDER — ACETAMINOPHEN 325 MG PO TABS
650.0000 mg | ORAL_TABLET | Freq: Once | ORAL | Status: AC
  Administered 2024-07-29: 650 mg via ORAL
  Filled 2024-07-29: qty 2

## 2024-07-29 MED ORDER — DIPHENHYDRAMINE HCL 25 MG PO CAPS
25.0000 mg | ORAL_CAPSULE | Freq: Once | ORAL | Status: AC
  Administered 2024-07-29: 25 mg via ORAL
  Filled 2024-07-29: qty 1

## 2024-07-29 MED ORDER — SODIUM CHLORIDE 0.9 % IV SOLN
510.0000 mg | Freq: Once | INTRAVENOUS | Status: AC
  Administered 2024-07-29: 510 mg via INTRAVENOUS
  Filled 2024-07-29: qty 17

## 2024-07-29 NOTE — Progress Notes (Signed)
 Diagnosis: Iron  Deficiency Anemia  Provider:  Praveen Mannam MD  Procedure: IV Infusion  IV Type: Peripheral, IV Location: L Hand  Feraheme  (Ferumoxytol ), Dose: 510 mg  Infusion Start Time: 0902  Infusion Stop Time: 0918  Post Infusion IV Care: Observation period completed and Peripheral IV Discontinued  Discharge: Condition: Good, Destination: Home . AVS Provided  Performed by:  Leita FORBES Miles, LPN

## 2024-08-05 ENCOUNTER — Ambulatory Visit: Admitting: *Deleted

## 2024-08-05 VITALS — BP 129/78 | HR 100 | Temp 98.6°F | Resp 16 | Ht 64.0 in | Wt 169.4 lb

## 2024-08-05 DIAGNOSIS — N920 Excessive and frequent menstruation with regular cycle: Secondary | ICD-10-CM

## 2024-08-05 DIAGNOSIS — D509 Iron deficiency anemia, unspecified: Secondary | ICD-10-CM | POA: Diagnosis not present

## 2024-08-05 DIAGNOSIS — N926 Irregular menstruation, unspecified: Secondary | ICD-10-CM

## 2024-08-05 MED ORDER — DIPHENHYDRAMINE HCL 25 MG PO CAPS: 25.0000 mg | ORAL_CAPSULE | Freq: Once | ORAL | Status: DC

## 2024-08-05 MED ORDER — SODIUM CHLORIDE 0.9 % IV SOLN
510.0000 mg | Freq: Once | INTRAVENOUS | Status: AC
  Administered 2024-08-05: 510 mg via INTRAVENOUS
  Filled 2024-08-05: qty 17

## 2024-08-05 MED ORDER — ACETAMINOPHEN 325 MG PO TABS: 650.0000 mg | ORAL_TABLET | Freq: Once | ORAL | Status: DC

## 2024-08-05 NOTE — Progress Notes (Signed)
 Diagnosis: Iron  Deficiency Anemia  Provider:  Mannam, Praveen MD  Procedure: IV Infusion  IV Type: Peripheral, IV Location: L Hand  Feraheme  (Ferumoxytol ), Dose: 510 mg  Infusion Start Time: 1136  Infusion Stop Time: 1200  Post Infusion IV Care: Observation period completed and Peripheral IV Discontinued  Discharge: Condition: Good, Destination: Home . AVS Provided  Performed by:  Katalina Magri E, RN

## 2024-09-23 ENCOUNTER — Encounter: Payer: Self-pay | Admitting: Hematology and Oncology

## 2024-09-30 ENCOUNTER — Inpatient Hospital Stay (HOSPITAL_BASED_OUTPATIENT_CLINIC_OR_DEPARTMENT_OTHER): Admitting: Hematology and Oncology

## 2024-09-30 ENCOUNTER — Encounter: Payer: Self-pay | Admitting: Hematology and Oncology

## 2024-09-30 ENCOUNTER — Telehealth: Payer: Self-pay

## 2024-09-30 ENCOUNTER — Inpatient Hospital Stay: Attending: Hematology and Oncology

## 2024-09-30 VITALS — BP 118/87 | HR 100 | Temp 98.9°F | Resp 18 | Ht 64.0 in | Wt 176.6 lb

## 2024-09-30 DIAGNOSIS — N926 Irregular menstruation, unspecified: Secondary | ICD-10-CM

## 2024-09-30 DIAGNOSIS — D509 Iron deficiency anemia, unspecified: Secondary | ICD-10-CM | POA: Diagnosis not present

## 2024-09-30 DIAGNOSIS — N939 Abnormal uterine and vaginal bleeding, unspecified: Secondary | ICD-10-CM | POA: Diagnosis not present

## 2024-09-30 DIAGNOSIS — Z01419 Encounter for gynecological examination (general) (routine) without abnormal findings: Secondary | ICD-10-CM | POA: Diagnosis not present

## 2024-09-30 LAB — CBC WITH DIFFERENTIAL (CANCER CENTER ONLY)
Abs Immature Granulocytes: 0.01 K/uL (ref 0.00–0.07)
Basophils Absolute: 0.1 K/uL (ref 0.0–0.1)
Basophils Relative: 1 %
Eosinophils Absolute: 0.1 K/uL (ref 0.0–0.5)
Eosinophils Relative: 2 %
HCT: 38.4 % (ref 36.0–46.0)
Hemoglobin: 12.6 g/dL (ref 12.0–15.0)
Immature Granulocytes: 0 %
Lymphocytes Relative: 24 %
Lymphs Abs: 1.3 K/uL (ref 0.7–4.0)
MCH: 28.8 pg (ref 26.0–34.0)
MCHC: 32.8 g/dL (ref 30.0–36.0)
MCV: 87.7 fL (ref 80.0–100.0)
Monocytes Absolute: 0.6 K/uL (ref 0.1–1.0)
Monocytes Relative: 10 %
Neutro Abs: 3.4 K/uL (ref 1.7–7.7)
Neutrophils Relative %: 63 %
Platelet Count: 266 K/uL (ref 150–400)
RBC: 4.38 MIL/uL (ref 3.87–5.11)
RDW: 14.8 % (ref 11.5–15.5)
WBC Count: 5.5 K/uL (ref 4.0–10.5)
nRBC: 0 % (ref 0.0–0.2)

## 2024-09-30 LAB — IRON AND IRON BINDING CAPACITY (CC-WL,HP ONLY)
Iron: 58 ug/dL (ref 28–170)
Saturation Ratios: 14 % (ref 10.4–31.8)
TIBC: 406 ug/dL (ref 250–450)
UIBC: 348 ug/dL

## 2024-09-30 LAB — RETICULOCYTES
Immature Retic Fract: 12.2 % (ref 2.3–15.9)
RBC.: 4.37 MIL/uL (ref 3.87–5.11)
Retic Count, Absolute: 77.3 K/uL (ref 19.0–186.0)
Retic Ct Pct: 1.8 % (ref 0.4–3.1)

## 2024-09-30 LAB — VITAMIN B12: Vitamin B-12: 748 pg/mL (ref 180–914)

## 2024-09-30 LAB — FERRITIN: Ferritin: 32 ng/mL (ref 11–307)

## 2024-09-30 NOTE — Progress Notes (Signed)
   [facility] OFFICE PROGRESS NOTE  Teresa Channel, MD  ASSESSMENT & PLAN:  Assessment & Plan Iron  deficiency anemia, unspecified iron  deficiency anemia type She denies infusion reactions with intravenous iron  Feraheme  Overall, she has improvement in energy level since her last infusion Her blood count is normal and iron  studies are adequate We discussed future follow-up She has appointment to see her primary care doctor in January and I will follow-up on test results Irregular menstrual bleeding I suspect she is perimenopausal I recommend the patient to reach out to her gynecologist to consider further management/hormone replacement therapy    No orders of the defined types were placed in this encounter.   INTERVAL HISTORY: Patient returns for recurrent anemia Symptoms of anemia includes none We reviewed CBC results  SUMMARY OF HEMATOLOGIC HISTORY:  She was found to have abnormal CBC from blood work monitoring She has been anemic for a long time due to heavy menstruation Last year around October, she was given intravenous iron  sucrose complicated by allergic reaction On review of her blood work dated back to November 2008, hemoglobin has been at around 10.5 Most recently, on Mar 26, 2023, she went to the emergency department due to fatigue and heavy menstruation and was found to have hemoglobin of 7.4 On April 15, 2023, she received blood transfusion with hemoglobin of 5.6.  She had pelvic ultrasound which revealed presence of 3 uterine fibroids.  She is scheduled for possibility of hysterectomy but that was canceled as the patient was not available due to family obligations. She denies recent chest pain on exertion, shortness of breath on minimal exertion, pre-syncopal episodes, or palpitations currently but she has felt lightheaded and significant shortness of breath when she needed blood transfusion in June  She had not noticed any recent bleeding such as  epistaxis, hematuria or hemoptysis.  She has intermittent hematochezia at least once a week which she associated with chronic constipation.  She was started on Linzess with success.  She is scheduled to see [facility] physician for colonoscopy in the near future. The patient has intermittent over the counter NSAID ingestion. She is not on antiplatelets agents.  She had no prior history or diagnosis of cancer. Her age appropriate screening programs are up-to-date. She has pica with craving but  eats a variety of diet. She never donated blood  The patient was prescribed oral iron  supplements and she takes oral iron  supplement every morning in the past 3 months with her protein shakes.  She complained of intermittent muscle cramps. Last intravenous iron  infusion was September 2024 She tolerated intravenous iron  Feraheme  well in July 2025 and September 2025   Lab Results  Component Value Date   VITAMINB12 2,109 (H) 05/23/2024   FERRITIN 89 07/01/2024   HGB 12.6 09/30/2024   RBC 4.38 09/30/2024   Vitals:   09/30/24 1008  BP: 118/87  Pulse: 100  Resp: 18  Temp: 98.9 F (37.2 C)  SpO2: 97%

## 2024-09-30 NOTE — Assessment & Plan Note (Addendum)
 I suspect she is perimenopausal I recommend the patient to reach out to her gynecologist to consider further management/hormone replacement therapy

## 2024-09-30 NOTE — Telephone Encounter (Signed)
 Called and left below message. Ask her to call the office for questions. ?

## 2024-09-30 NOTE — Telephone Encounter (Signed)
-----   Message from Krystal Arnold sent at 09/30/2024 12:50 PM EST ----- Her iron  studies and B12 are ok, let her know

## 2024-09-30 NOTE — Assessment & Plan Note (Addendum)
 She denies infusion reactions with intravenous iron  Feraheme  Overall, she has improvement in energy level since her last infusion Her blood count is normal and iron  studies are adequate We discussed future follow-up She has appointment to see her primary care doctor in January and I will follow-up on test results

## 2024-10-05 ENCOUNTER — Other Ambulatory Visit: Payer: Self-pay | Admitting: Obstetrics and Gynecology

## 2024-10-05 DIAGNOSIS — N939 Abnormal uterine and vaginal bleeding, unspecified: Secondary | ICD-10-CM | POA: Diagnosis not present

## 2024-10-05 DIAGNOSIS — D259 Leiomyoma of uterus, unspecified: Secondary | ICD-10-CM | POA: Diagnosis not present

## 2024-10-10 LAB — SURGICAL PATHOLOGY

## 2024-11-17 ENCOUNTER — Telehealth: Payer: Self-pay

## 2024-11-17 NOTE — Telephone Encounter (Signed)
 Called and left a below message asking her to call the office back.

## 2024-11-18 NOTE — Telephone Encounter (Signed)
 Called back and left another message to call the office. Will send a mychart message.

## 2024-11-18 NOTE — Telephone Encounter (Signed)
 She called back. Given below message from Dr. Lonn. She verbalized understanding and will follow up with PCP with labs in 6 months.
